# Patient Record
Sex: Female | Born: 1985 | Race: White | Hispanic: No | Marital: Single | Smoking: Former smoker
Health system: Southern US, Community
[De-identification: ages and names within clinical notes are randomized; demographics above are authoritative.]

## PROBLEM LIST (undated history)

## (undated) ENCOUNTER — Inpatient Hospital Stay: Payer: Self-pay

## (undated) DIAGNOSIS — K219 Gastro-esophageal reflux disease without esophagitis: Secondary | ICD-10-CM

## (undated) DIAGNOSIS — F419 Anxiety disorder, unspecified: Secondary | ICD-10-CM

## (undated) HISTORY — PX: HAND SURGERY: SHX662

## (undated) HISTORY — PX: DILATION AND CURETTAGE OF UTERUS: SHX78

## (undated) HISTORY — PX: WISDOM TOOTH EXTRACTION: SHX21

---
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
## 2007-03-23 ENCOUNTER — Observation Stay: Payer: Self-pay | Admitting: Unknown Physician Specialty

## 2007-05-07 ENCOUNTER — Observation Stay: Payer: Self-pay | Admitting: Obstetrics and Gynecology

## 2007-05-11 ENCOUNTER — Observation Stay: Payer: Self-pay

## 2007-05-16 ENCOUNTER — Inpatient Hospital Stay: Payer: Self-pay | Admitting: Obstetrics & Gynecology

## 2015-11-17 LAB — OB RESULTS CONSOLE VARICELLA ZOSTER ANTIBODY, IGG: VARICELLA IGG: IMMUNE

## 2015-11-17 LAB — OB RESULTS CONSOLE RUBELLA ANTIBODY, IGM: RUBELLA: IMMUNE

## 2016-03-19 LAB — OB RESULTS CONSOLE HIV ANTIBODY (ROUTINE TESTING): HIV: NONREACTIVE

## 2016-03-19 LAB — OB RESULTS CONSOLE RPR: RPR: NONREACTIVE

## 2016-03-19 LAB — OB RESULTS CONSOLE HEPATITIS B SURFACE ANTIGEN: HEP B S AG: NEGATIVE

## 2016-04-01 NOTE — L&D Delivery Note (Signed)
Delivery Note At 8:45 PM a viable female was delivered via Vaginal, Spontaneous Delivery (Presentation: LOA).  APGAR: 6, 9; weight 7 lb 0.2 oz (3180 g).   Placenta status: delivered spontaneously, intact.  Cord: 3VC with the following complications: None.  Cord pH: n/a  Anesthesia: epidural  Episiotomy: None Lacerations: None Suture Repair: n/a Est. Blood Loss (mL): 300  Mom to postpartum.  Baby to Couplet care / Skin to Skin.  Called to see patient.  Mom pushed to deliver a viable female infant.  The head followed by shoulders, which delivered without difficulty, and the rest of the body.  A single nuchal cord noted and reduced.  Baby to mom's chest.  Cord clamped and cut with no delay given initial assessment of pediatrics.  Cord blood obtained.  Placenta delivered spontaneously, intact, with a 3-vessel cord.  No vaginal, cervical, or perineal lacerations.  All counts correct.  Hemostasis obtained with IV pitocin and fundal massage. EBL 300 mL.     Thomasene MohairStephen Lea Baine, MD 06/16/2016, 11:03 PM

## 2016-05-08 ENCOUNTER — Encounter: Payer: Self-pay | Admitting: Emergency Medicine

## 2016-05-08 ENCOUNTER — Emergency Department
Admission: EM | Admit: 2016-05-08 | Discharge: 2016-05-08 | Disposition: A | Discharge status: Home / Self Care | Payer: Medicaid Other | Attending: Emergency Medicine | Admitting: Emergency Medicine

## 2016-05-08 DIAGNOSIS — O26893 Other specified pregnancy related conditions, third trimester: Secondary | ICD-10-CM | POA: Diagnosis present

## 2016-05-08 DIAGNOSIS — H9201 Otalgia, right ear: Secondary | ICD-10-CM | POA: Insufficient documentation

## 2016-05-08 DIAGNOSIS — Z3A32 32 weeks gestation of pregnancy: Secondary | ICD-10-CM | POA: Insufficient documentation

## 2016-05-08 DIAGNOSIS — Z5321 Procedure and treatment not carried out due to patient leaving prior to being seen by health care provider: Secondary | ICD-10-CM | POA: Insufficient documentation

## 2016-05-08 NOTE — ED Triage Notes (Signed)
States she is 8 months pregnant, was diagnosed with the flu last week and was told by urgent care she had a busted right ear drum, states continued right ear pain

## 2016-05-08 NOTE — ED Notes (Signed)
Called in lobby, no response 

## 2016-05-08 NOTE — ED Notes (Signed)
Called pt in lobby, no response.  

## 2016-05-09 ENCOUNTER — Telehealth: Payer: Self-pay | Admitting: Emergency Medicine

## 2016-05-09 NOTE — Telephone Encounter (Signed)
Called patient due to lwot to inquire about condition and follow up plans. Left message.   

## 2016-05-14 ENCOUNTER — Observation Stay
Admission: EM | Admit: 2016-05-14 | Discharge: 2016-05-15 | Disposition: A | Discharge status: Other Acute Inpatient Hospital | Payer: Medicaid Other | Source: Home / Self Care | Attending: Emergency Medicine | Admitting: Emergency Medicine

## 2016-05-14 ENCOUNTER — Encounter: Payer: Self-pay | Admitting: Emergency Medicine

## 2016-05-14 ENCOUNTER — Emergency Department: Payer: Medicaid Other

## 2016-05-14 DIAGNOSIS — J189 Pneumonia, unspecified organism: Secondary | ICD-10-CM | POA: Diagnosis present

## 2016-05-14 DIAGNOSIS — O99513 Diseases of the respiratory system complicating pregnancy, third trimester: Secondary | ICD-10-CM

## 2016-05-14 DIAGNOSIS — R042 Hemoptysis: Secondary | ICD-10-CM

## 2016-05-14 LAB — COMPREHENSIVE METABOLIC PANEL
ALT: 22 U/L (ref 14–54)
ANION GAP: 11 (ref 5–15)
AST: 26 U/L (ref 15–41)
Albumin: 2.7 g/dL — ABNORMAL LOW (ref 3.5–5.0)
Alkaline Phosphatase: 166 U/L — ABNORMAL HIGH (ref 38–126)
BUN: 5 mg/dL — ABNORMAL LOW (ref 6–20)
CALCIUM: 8.5 mg/dL — AB (ref 8.9–10.3)
CHLORIDE: 102 mmol/L (ref 101–111)
CO2: 21 mmol/L — AB (ref 22–32)
CREATININE: 0.42 mg/dL — AB (ref 0.44–1.00)
Glucose, Bld: 97 mg/dL (ref 65–99)
Potassium: 3.3 mmol/L — ABNORMAL LOW (ref 3.5–5.1)
SODIUM: 134 mmol/L — AB (ref 135–145)
Total Bilirubin: 0.9 mg/dL (ref 0.3–1.2)
Total Protein: 7 g/dL (ref 6.5–8.1)

## 2016-05-14 LAB — CBC WITH DIFFERENTIAL/PLATELET
Basophils Absolute: 0 10*3/uL (ref 0–0.1)
Basophils Relative: 0 %
EOS ABS: 0.1 10*3/uL (ref 0–0.7)
EOS PCT: 1 %
HCT: 36.5 % (ref 35.0–47.0)
Hemoglobin: 12.2 g/dL (ref 12.0–16.0)
LYMPHS ABS: 2.1 10*3/uL (ref 1.0–3.6)
LYMPHS PCT: 12 %
MCH: 27.4 pg (ref 26.0–34.0)
MCHC: 33.5 g/dL (ref 32.0–36.0)
MCV: 81.7 fL (ref 80.0–100.0)
MONO ABS: 1.4 10*3/uL — AB (ref 0.2–0.9)
MONOS PCT: 8 %
Neutro Abs: 13.6 10*3/uL — ABNORMAL HIGH (ref 1.4–6.5)
Neutrophils Relative %: 79 %
PLATELETS: 479 10*3/uL — AB (ref 150–440)
RBC: 4.46 MIL/uL (ref 3.80–5.20)
RDW: 14.1 % (ref 11.5–14.5)
WBC: 17.2 10*3/uL — AB (ref 3.6–11.0)

## 2016-05-14 LAB — FIBRIN DERIVATIVES D-DIMER (ARMC ONLY): FIBRIN DERIVATIVES D-DIMER (ARMC): 847 — AB (ref 0–499)

## 2016-05-14 LAB — INFLUENZA PANEL BY PCR (TYPE A & B)
INFLAPCR: NEGATIVE
INFLBPCR: NEGATIVE

## 2016-05-14 LAB — LACTIC ACID, PLASMA: LACTIC ACID, VENOUS: 1 mmol/L (ref 0.5–1.9)

## 2016-05-14 MED ORDER — ONDANSETRON HCL 4 MG/2ML IJ SOLN
4.0000 mg | Freq: Four times a day (QID) | INTRAMUSCULAR | Status: DC | PRN
  Administered 2016-05-14: 4 mg via INTRAVENOUS

## 2016-05-14 MED ORDER — ACETAMINOPHEN 325 MG PO TABS: 650.0000 mg | ORAL_TABLET | ORAL | Status: DC | PRN

## 2016-05-14 MED ORDER — CEFTRIAXONE SODIUM-DEXTROSE 1-3.74 GM-% IV SOLR
1.0000 g | Freq: Once | INTRAVENOUS | Status: AC
  Administered 2016-05-14: 1 g via INTRAVENOUS
  Filled 2016-05-14: qty 50

## 2016-05-14 MED ORDER — IPRATROPIUM-ALBUTEROL 0.5-2.5 (3) MG/3ML IN SOLN
3.0000 mL | Freq: Four times a day (QID) | RESPIRATORY_TRACT | Status: DC
  Administered 2016-05-14: 3 mL via RESPIRATORY_TRACT
  Filled 2016-05-14: qty 3

## 2016-05-14 MED ORDER — ACETAMINOPHEN 500 MG PO TABS
ORAL_TABLET | ORAL | Status: AC
  Administered 2016-05-14: 1000 mg via ORAL
  Filled 2016-05-14: qty 2

## 2016-05-14 MED ORDER — IOPAMIDOL (ISOVUE-370) INJECTION 76%
75.0000 mL | Freq: Once | INTRAVENOUS | Status: AC | PRN
  Administered 2016-05-14: 75 mL via INTRAVENOUS

## 2016-05-14 MED ORDER — DEXTROSE 5 % IV SOLN: 1.0000 g | Freq: Once | INTRAVENOUS | Status: DC

## 2016-05-14 MED ORDER — AZITHROMYCIN 250 MG PO TABS
250.0000 mg | ORAL_TABLET | Freq: Every day | ORAL | Status: DC
Start: 2016-05-15 — End: 2016-05-15

## 2016-05-14 MED ORDER — CALCIUM CARBONATE ANTACID 500 MG PO CHEW: 2.0000 | CHEWABLE_TABLET | ORAL | Status: DC | PRN

## 2016-05-14 MED ORDER — DEXTROSE 5 % IV SOLN
500.0000 mg | Freq: Once | INTRAVENOUS | Status: DC
  Administered 2016-05-14: 500 mg via INTRAVENOUS
  Filled 2016-05-14: qty 500

## 2016-05-14 MED ORDER — ACETAMINOPHEN 500 MG PO TABS
1000.0000 mg | ORAL_TABLET | Freq: Once | ORAL | Status: AC
  Administered 2016-05-14: 1000 mg via ORAL

## 2016-05-14 MED ORDER — SODIUM CHLORIDE 0.9 % IV SOLN
INTRAVENOUS | Status: DC
  Administered 2016-05-14: 23:00:00 via INTRAVENOUS

## 2016-05-14 MED ORDER — SODIUM CHLORIDE 0.9 % IV BOLUS (SEPSIS)
1000.0000 mL | Freq: Once | INTRAVENOUS | Status: AC
  Administered 2016-05-14: 1000 mL via INTRAVENOUS

## 2016-05-14 MED ORDER — DOCUSATE SODIUM 100 MG PO CAPS: 100.0000 mg | ORAL_CAPSULE | Freq: Every day | ORAL | Status: DC

## 2016-05-14 MED ORDER — ALBUTEROL SULFATE (2.5 MG/3ML) 0.083% IN NEBU
2.5000 mg | INHALATION_SOLUTION | Freq: Once | RESPIRATORY_TRACT | Status: AC
  Administered 2016-05-14: 2.5 mg via RESPIRATORY_TRACT
  Filled 2016-05-14: qty 3

## 2016-05-14 MED ORDER — PRENATAL MULTIVITAMIN CH: 1.0000 | ORAL_TABLET | Freq: Every day | ORAL | Status: DC

## 2016-05-14 MED ORDER — ONDANSETRON HCL 4 MG/2ML IJ SOLN
INTRAMUSCULAR | Status: AC
  Administered 2016-05-14: 4 mg via INTRAVENOUS
  Filled 2016-05-14: qty 2

## 2016-05-14 NOTE — ED Triage Notes (Addendum)
Pt c/o green productive cough. Was dx with flu a couple weeks ago and finished tamiflu 1.5 weeks ago. Has been on Augmentin for ear infection. Has had decreased hearing in right ear. Feels SHOB. All sx have been gradually worsening over last week and half. Mild tachypnea but not labored at this time.

## 2016-05-14 NOTE — ED Notes (Signed)
Pt taken to US via stretcher.  Attempted to find FHT with external doppler. Was unsuccessful. Lea, RN found heart beat with US machine. Strong and consistent.

## 2016-05-14 NOTE — ED Notes (Signed)
Pt presents with cough x 1.5 weeks. She also has bilateral ear pain. States she was seen recently for ruptured tympanic membrane and put on Augmentin, beginning 6 days ago. She feels that her left ear is now involved. She states she is sob. Pt is [redacted] weeks pregnant.

## 2016-05-14 NOTE — ED Provider Notes (Signed)
[facility] Emergency Department Provider Note ____________________________________________   I have reviewed the triage vital signs and the triage nursing note.  HISTORY  Chief Complaint Cough   Historian Patient, aunt and significant other  HPI Karen Duarte is a 31 y.o. female presents approximately [redacted] weeks pregnant stating that she has  As been feeling unwell with a respiratory condition for about 2 weeks now. Several weeks ago she developed flulike symptoms and cough and was treated presumptively with a course of Tamiflu and finished about a week ago. She was not actually tested at that time. Since then she continued to have congestion and cough especially when she would take a deep breath. She's continued to feel overall poorly with decreased by mouth intake.She feels like she is probably dehydrated.No recent fever. She states that she was seen at another health care facility and had a head CT given ear pain and was placed on Augmentin which she is currently taking. Today she went to her OB/GYN office and because of persistent respiratory symptoms and the fact that her abdomen was measuring at 32 weeks as opposed to 35 weeks, she was referred into the emergency room for further evaluation.  She is quite frustrated at her persistence of symptoms and ill state, and she is very anxious and worried about the baby. States that she is feeling the baby move, but maybe less so than typical.    History reviewed. No pertinent past medical history.  There are no active problems to display for this patient.   History reviewed. No pertinent surgical history.  Prior to Admission medications   Not on File    No Known Allergies  History reviewed. No pertinent family history.  Social History Social History  Substance Use Topics  . Smoking status: Former Games developer  . Smokeless tobacco: Never Used  . Alcohol use No    Review of Systems  Constitutional:  Negative for fever. Eyes: Negative for visual changes. ENT: Negative for sore throat. Cardiovascular: positive for central chest pressure, positive for somewhat worse with breathing. Respiratory: positive for coughing, productive of sputum as well as blood, and for shortness of breath. Gastrointestinal: Negative for abdominal pain, vomiting and diarrhea. Genitourinary: Negative for dysuria. Musculoskeletal: Negative for back pain. Skin: Negative for rash. Neurological: Negative for headache. 10 point Review of Systems otherwise negative ____________________________________________   PHYSICAL EXAM:  VITAL SIGNS: ED Triage Vitals  Enc Vitals Group     BP 05/14/16 1627 116/77     Pulse Rate 05/14/16 1627 (!) 110     Resp 05/14/16 1627 (!) 26     Temp 05/14/16 1627 98.1 F (36.7 C)     Temp Source 05/14/16 1627 Oral     SpO2 05/14/16 1627 97 %     Weight 05/14/16 1624 147 lb (66.7 kg)     Height 05/14/16 1624 5\' 3"  (1.6 m)     Head Circumference --      Peak Flow --      Pain Score --      Pain Loc --      Pain Edu? --      Excl. in GC? --      Constitutional: Alert and oriented. Well appearing and in no distress. HEENT   Head: Normocephalic and atraumatic.      Eyes: Conjunctivae are normal. PERRL. Normal extraocular movements.      Ears:         Nose: No congestion/rhinnorhea.   Mouth/Throat: Mucous  membranes are ildly dry.   Neck: No stridor. Cardiovascular/Chest:tachycardic, regular rhythm.  No murmurs, rubs, or gallops. Respiratory: Normal respiratory effort without tachypnea nor retractions. With the breast expiratory wheezing and bronchospastic cough. No rhonchi or rales Gastrointestinal: Soft. No distention, no guarding, no rebound. Nontender. Gravid well above the umbilicus Genitourinary/rectal:Deferred Musculoskeletal: Nontender with normal range of motion in all extremities. No joint effusions.  No lower extremity tenderness.  No edema. Neurologic:   Normal speech and language. No gross or focal neurologic deficits are appreciated. Skin:  Skin is warm, dry and intact. No rash noted. Psychiatric: Mood and affect are normal. Speech and behavior are normal. Patient exhibits appropriate insight and judgment.     ____________________________________________  LABS (pertinent positives/negatives)  Labs Reviewed  CBC WITH DIFFERENTIAL/PLATELET - Abnormal; Notable for the following:       Result Value   WBC 17.2 (*)    Platelets 479 (*)    Neutro Abs 13.6 (*)    Monocytes Absolute 1.4 (*)    All other components within normal limits  COMPREHENSIVE METABOLIC PANEL - Abnormal; Notable for the following:    Sodium 134 (*)    Potassium 3.3 (*)    CO2 21 (*)    BUN 5 (*)    Creatinine, Ser 0.42 (*)    Calcium 8.5 (*)    Albumin 2.7 (*)    Alkaline Phosphatase 166 (*)    All other components within normal limits  FIBRIN DERIVATIVES D-DIMER ([facility] ONLY) - Abnormal; Notable for the following:    Fibrin derivatives D-dimer ([facility]) 847 (*)    All other components within normal limits  CULTURE, BLOOD (ROUTINE X 2)  CULTURE, BLOOD (ROUTINE X 2)  INFLUENZA PANEL BY PCR (TYPE A & B)    ____________________________________________    EKG I, Governor Rooksebecca Susy Placzek, MD, the attending physician have personally viewed and interpreted all ECGs.  111 bpm. Sinus tachycardia. Narrow QRS. Normal axis. Nonspecific T wave. ____________________________________________  RADIOLOGY All Xrays were viewed by me. Imaging interpreted by Radiologist.  Chest x-ray two-view:IMPRESSION: No edema or consolidation.  Ultrasound lower extremity venous Dopplers:  IMPRESSION: No evidence of deep venous thrombosis in either lower extremity. Slow/Rouleaux type flow noted in each lower extremity. Clinical significance of this finding uncertain.  Chest CT angiogram:  IMPRESSION: 1. No CT evidence for acute pulmonary embolism. 2. Multifocal ground-glass opacities involving  the bilateral lungs, most notable within the right middle lobe and lingula. Findings most suspicious for multifocal infection/pneumonia. 3. Mildly enlarged 12 mm right hilar lymph node, likely reactive.  __________________________________________  PROCEDURES  Procedure(s) performed: None  Critical Care performed: None  ____________________________________________   ED COURSE / ASSESSMENT AND PLAN  Pertinent labs & imaging results that were available during my care of the patient were reviewed by me and considered in my medical decision making (see chart for details).   Ms. Rubye OaksDickerson presented from the OB/GYN office for continued respiratory symptoms and small for dates abdominal measurement. On exam she has significant wheezing/bronchospasm and describes recent flulike illness for which she was treated presumptively with Tamiflu, and currently on Augmentin but persistent coughing including hemoptysis and productive cough.  No hypoxia. She is tachycardic here, but states that her normal is 130 bpm.  We discussed treatment with albuterol in checking with an x-ray, suspecting that PE is unlikely with an alternate diagnosis of bronchitis/bronchospasm much more likely. Patient did not experience much improvement after trial of albuterol. I spoke with Dr. Chauncey CruelStabler who did recommend conservative stepwise  evaluation including d-dimer and lower extremity ultrasound.  Laboratory studies show elevated white blood cell count with left shift. Again consider failed outpatient infection, but chest x-ray clear pneumonia.  D-dimer came back significantly elevated above 800. CT angios was performed and there is no PE, but multifocal pneumonia. Blood cultures were obtained and patient was placed on Rocephin and azithromycin. I spoke again with Dr. Chauncey Cruel who will admit the patient. Although I do not have a high suspicion for sepsis, and patient states that her heart rate is normally elevated, she doesn't  fact have a source of infection on CT, as well as elevated white blood cell count and tachycardia, I did add a lactate. For now clinical hydration, patient had received 1 L normal saline. Lactate pending upon consultation for admission to OB/GYN.  CONSULTATIONS:   Dr. Chauncey Cruel, OB/GYN for admission.   Patient / Family / Caregiver informed of clinical course, medical decision-making process, and agree with plan.   ___________________________________________   FINAL CLINICAL IMPRESSION(S) / ED DIAGNOSES   Final diagnoses:  Cough with hemoptysis  Multifocal pneumonia              Note: This dictation was prepared with Dragon dictation. Any transcriptional errors that result from this process are unintentional    Governor Rooks, MD 05/14/16 [id number]

## 2016-05-14 NOTE — ED Notes (Signed)
Pt given underwear and pad. Urinated when she coughed.

## 2016-05-15 ENCOUNTER — Inpatient Hospital Stay: Payer: Medicaid Other

## 2016-05-15 ENCOUNTER — Inpatient Hospital Stay
Admit: 2016-05-15 | Discharge: 2016-05-18 | DRG: 781 | Disposition: A | Discharge status: Home / Self Care | Payer: Medicaid Other | Attending: Obstetrics and Gynecology | Admitting: Obstetrics and Gynecology

## 2016-05-15 DIAGNOSIS — E876 Hypokalemia: Secondary | ICD-10-CM | POA: Diagnosis present

## 2016-05-15 DIAGNOSIS — A419 Sepsis, unspecified organism: Secondary | ICD-10-CM | POA: Diagnosis present

## 2016-05-15 DIAGNOSIS — F17210 Nicotine dependence, cigarettes, uncomplicated: Secondary | ICD-10-CM | POA: Diagnosis present

## 2016-05-15 DIAGNOSIS — O99613 Diseases of the digestive system complicating pregnancy, third trimester: Secondary | ICD-10-CM | POA: Diagnosis present

## 2016-05-15 DIAGNOSIS — O9989 Other specified diseases and conditions complicating pregnancy, childbirth and the puerperium: Secondary | ICD-10-CM | POA: Diagnosis present

## 2016-05-15 DIAGNOSIS — Z3A35 35 weeks gestation of pregnancy: Secondary | ICD-10-CM | POA: Diagnosis not present

## 2016-05-15 DIAGNOSIS — O99513 Diseases of the respiratory system complicating pregnancy, third trimester: Principal | ICD-10-CM | POA: Diagnosis present

## 2016-05-15 DIAGNOSIS — O99333 Smoking (tobacco) complicating pregnancy, third trimester: Secondary | ICD-10-CM | POA: Diagnosis present

## 2016-05-15 DIAGNOSIS — O98813 Other maternal infectious and parasitic diseases complicating pregnancy, third trimester: Secondary | ICD-10-CM | POA: Diagnosis present

## 2016-05-15 DIAGNOSIS — H6693 Otitis media, unspecified, bilateral: Secondary | ICD-10-CM | POA: Diagnosis present

## 2016-05-15 DIAGNOSIS — K219 Gastro-esophageal reflux disease without esophagitis: Secondary | ICD-10-CM | POA: Diagnosis present

## 2016-05-15 DIAGNOSIS — J189 Pneumonia, unspecified organism: Secondary | ICD-10-CM | POA: Diagnosis present

## 2016-05-15 DIAGNOSIS — R05 Cough: Secondary | ICD-10-CM | POA: Diagnosis present

## 2016-05-15 DIAGNOSIS — O365990 Maternal care for other known or suspected poor fetal growth, unspecified trimester, not applicable or unspecified: Secondary | ICD-10-CM

## 2016-05-15 HISTORY — DX: Gastro-esophageal reflux disease without esophagitis: K21.9

## 2016-05-15 LAB — EXPECTORATED SPUTUM ASSESSMENT W REFEX TO RESP CULTURE

## 2016-05-15 LAB — EXPECTORATED SPUTUM ASSESSMENT W GRAM STAIN, RFLX TO RESP C: Special Requests: NORMAL

## 2016-05-15 MED ORDER — DEXTROSE 5 % IV SOLN
1.0000 g | Freq: Two times a day (BID) | INTRAVENOUS | Status: DC
  Administered 2016-05-15 – 2016-05-18 (×7): 1 g via INTRAVENOUS
  Filled 2016-05-15 (×9): qty 10

## 2016-05-15 MED ORDER — METHYLPREDNISOLONE SODIUM SUCC 125 MG IJ SOLR
40.0000 mg | Freq: Every day | INTRAMUSCULAR | Status: DC
  Administered 2016-05-15 – 2016-05-17 (×3): 40 mg via INTRAVENOUS
  Filled 2016-05-15 (×4): qty 2

## 2016-05-15 MED ORDER — DEXTROSE 5 % IV SOLN
500.0000 mg | INTRAVENOUS | Status: DC
  Administered 2016-05-15 – 2016-05-16 (×2): 500 mg via INTRAVENOUS
  Filled 2016-05-15 (×3): qty 500

## 2016-05-15 MED ORDER — GUAIFENESIN-CODEINE 100-10 MG/5ML PO SOLN
10.0000 mL | ORAL | Status: DC | PRN
  Administered 2016-05-15 – 2016-05-18 (×17): 10 mL via ORAL
  Filled 2016-05-15 (×19): qty 10

## 2016-05-15 MED ORDER — ONDANSETRON HCL 4 MG/2ML IJ SOLN
4.0000 mg | Freq: Four times a day (QID) | INTRAMUSCULAR | Status: DC | PRN
  Administered 2016-05-15: 4 mg via INTRAVENOUS
  Filled 2016-05-15: qty 2

## 2016-05-15 MED ORDER — IPRATROPIUM-ALBUTEROL 0.5-2.5 (3) MG/3ML IN SOLN
3.0000 mL | RESPIRATORY_TRACT | Status: DC | PRN
  Administered 2016-05-15 – 2016-05-18 (×14): 3 mL via RESPIRATORY_TRACT
  Filled 2016-05-15 (×15): qty 3

## 2016-05-15 MED ORDER — GUAIFENESIN ER 600 MG PO TB12
600.0000 mg | ORAL_TABLET | Freq: Two times a day (BID) | ORAL | Status: DC
  Administered 2016-05-15 – 2016-05-18 (×8): 600 mg via ORAL
  Filled 2016-05-15 (×8): qty 1

## 2016-05-15 MED ORDER — ACETAMINOPHEN 500 MG PO TABS
1000.0000 mg | ORAL_TABLET | Freq: Four times a day (QID) | ORAL | Status: DC | PRN
  Administered 2016-05-15 (×3): 1000 mg via ORAL
  Filled 2016-05-15 (×3): qty 2

## 2016-05-15 MED ORDER — PRENATAL MULTIVITAMIN CH
1.0000 | ORAL_TABLET | Freq: Every day | ORAL | Status: DC
  Administered 2016-05-15 – 2016-05-17 (×2): 1 via ORAL
  Filled 2016-05-15 (×4): qty 1

## 2016-05-15 MED ORDER — DIPHENHYDRAMINE HCL 25 MG PO CAPS
25.0000 mg | ORAL_CAPSULE | Freq: Four times a day (QID) | ORAL | Status: DC | PRN
  Administered 2016-05-15 – 2016-05-18 (×3): 25 mg via ORAL
  Filled 2016-05-15 (×3): qty 1

## 2016-05-15 MED ORDER — CEFTRIAXONE SODIUM 1 G IJ SOLR: 1.0000 g | Freq: Two times a day (BID) | INTRAMUSCULAR | Status: DC

## 2016-05-15 MED ORDER — SODIUM CHLORIDE 0.9 % IV SOLN
INTRAVENOUS | Status: DC
  Administered 2016-05-15 – 2016-05-18 (×7): via INTRAVENOUS

## 2016-05-15 MED ORDER — SODIUM CHLORIDE FLUSH 0.9 % IV SOLN
INTRAVENOUS | Status: AC
  Administered 2016-05-15: 02:00:00
  Filled 2016-05-15: qty 10

## 2016-05-15 MED ORDER — BUDESONIDE 0.5 MG/2ML IN SUSP
0.5000 mg | Freq: Two times a day (BID) | RESPIRATORY_TRACT | Status: DC
  Administered 2016-05-15 – 2016-05-18 (×7): 0.5 mg via RESPIRATORY_TRACT
  Filled 2016-05-15 (×8): qty 2

## 2016-05-15 MED ORDER — DEXTROSE 5 % IV SOLN: 1.0000 g | INTRAVENOUS | Status: DC

## 2016-05-15 NOTE — Progress Notes (Signed)
Benign Gynecology Progress Note  Admission Date: 05/15/2016 Current Date: 05/15/2016  Karen Duarte is a 31 y.o. [id number] HD#1 @ 682w4d, admitted for fever/malaise/cough with dx of PNEUMONIA .  History complicated by: Patient Active Problem List   Diagnosis Date Noted  . Pneumonia affecting pregnancy in third trimester 05/14/2016   ROS and patient/family/surgical history, located on admission H&P note dated 05/15/2016, have been reviewed, and there are no changes except as noted below  Yesterday/Overnight Events:  Still has cough. Receiving nebulizer treatments, as well as ABX.  Subjective:  Feels a little better but still weak, tired, coughing, hot and cold.  Objective:   Vitals:   05/15/16 0213 05/15/16 0322 05/15/16 0403 05/15/16 0806  BP:  (!) 90/59  107/66  Pulse:  (!) 106  (!) 107  Resp:  19  (!) 24  Temp:  98.3 F (36.8 C)  (!) 100.4 F (38 C)  TempSrc:  Axillary  Oral  SpO2:  99% 97% 99%  Weight: 147 lb (66.7 kg)     Height: 5\' 3"  (1.6 m)      Intake/Output Summary (Last 24 hours) at 05/15/16 0851 Last data filed at 05/15/16 0800  Gross per 24 hour  Intake           691.67 ml  Output              370 ml  Net           321.67 ml   Physical exam: General appearance: alert, cooperative and no distress Abdomen: Vtx, gravid, NT GU: No gross VB Lungs: rhonchi bilaterally Heart: regular rate and rhythm and no MRGs Extremities: no redness or tenderness in the calves or thighs, no edema Skin: no lesions Psych: appropriate   Recent Labs Lab 05/14/16 1629  NA 134*  K 3.3*  CL 102  CO2 21*  BUN 5*  CREATININE 0.42*  GLUCOSE 97    Recent Labs Lab 05/14/16 1629  WBC 17.2*  HGB 12.2  HCT 36.5  PLT 479*     Recent Labs Lab 05/14/16 1629  CALCIUM 8.5*   No results for input(s): INR, APTT in the last 168 hours.     Recent Labs Lab 05/14/16 1629  ALKPHOS 166*  BILITOT 0.9  PROT 7.0  ALT 22  AST 26     Assessment & Plan:  [redacted] weeks  pregnant Pneumonia Prior flu, s/p Tamiflu  Azithromycin and Rocephin IV started Nebulizer treatments Management of sx's US today for fetal growth Monitor Fetal Wellbeing daily Diet, Amb Consult Medicine for short and long term pneumonia management

## 2016-05-15 NOTE — H&P (Signed)
OB History & Physical   History of Present Illness:  Chief Complaint:  Presents from ED with pneumonia per CT. Had originally presented to ER with cough and SOB. HPI:  Karen Duarte is a 31 y.o. [phone] female with EDC=06/15/2016 at 3892w4d dated by a 10 wk 6 d ultrasound.  Her pregnancy has been complicated by flulike symptoms and was treated presumptively for the flu with Tamiflu (RX 2/5). She was then diagnosed with a right ear infection/ [facility] and was treated with amoxicillin then Augmentin. She presented to the office 2/13 with complaints of congestion, cough, pain when taking a deep breath, green sputum and rhinorrhea. She was sent to the ER and a CT revealed multifocal ground glass opacities involving bilateral lungs, most notable in the RML and lingula, suspicious for pneumonia. She was started on Azithromycin and Rocephin and given 2 nebulizer treatments and is sent to L&D for evaluation of fetal status. She was measuring 33 cm today in the clinic today. (34 cm at 33wk4d) . Denies contractions.    Prenatal care [facility]. Received TDAP on 04/16/2016       Maternal Medical History:  History reviewed. No pertinent past medical history.  Past Surgical History:  Procedure Laterality Date  . HAND SURGERY      No Known Allergies  Prior to Admission medications   Medication Sig Start Date End Date Taking? Authorizing Provider  acetaminophen (TYLENOL) 325 MG tablet Take 650 mg by mouth every 6 (six) hours as needed for mild pain, fever or headache.    Historical Provider, MD  amoxicillin-clavulanate (AUGMENTIN) 875-125 MG tablet Take 1 tablet by mouth 2 (two) times daily. 05/08/16 05/17/16  Historical Provider, MD  dextromethorphan (DELSYM) 30 MG/5ML liquid Take 30 mg by mouth as needed for cough.    Historical Provider, MD  doxylamine, Sleep, (UNISOM) 25 MG tablet Take 25 mg by mouth at bedtime as needed for sleep.    Historical Provider,  MD  guaiFENesin (MUCINEX) 600 MG 12 hr tablet Take 600 mg by mouth 2 (two) times daily as needed for cough.    Historical Provider, MD  ibuprofen (ADVIL,MOTRIN) 200 MG tablet Take 400 mg by mouth every 6 (six) hours as needed for fever, headache or mild pain.    Historical Provider, MD          Social History: She  reports that she has been smoking Cigarettes.  She has been smoking about 0.25 packs per day. She has never used smokeless tobacco. She reports that she does not drink alcohol or use drugs. Last smoked 1 week ago.  Family History: family history includes Diabetes Mellitus I in her father; Diabetes Mellitus II in her paternal grandmother; Hypertension in her paternal grandmother.   Review of Systems: Negative x 10 systems reviewed except as noted in the HPI.      Physical Exam:  Vital Signs: BP 124/66 (BP Location: Left Arm)   Pulse (!) 119   Temp 97.8 F (36.6 C) (Oral)   Ht 5\' 3"  (1.6 m)   Wt 147 lb (66.7 kg)   BMI 26.04 kg/m   General: coughing frequently, appears pale HEENT: conjunctiva pale Heart: tachycardic and regular rhythm. No murmurs/rubs/gallops Lungs: clear to auscultation bilaterally Abdomen: soft, gravid, non-tender;  Cephalic presentation Pelvic: Deferred, not contracting   Extremities: non-tender, symmetric, no edema bilaterally.   Neurologic: Alert & oriented x 3.    Pertinent Results:  Prenatal Labs: Blood type/Rh  O positive  Antibody screen negative  Rubella Varicella Immune immune  RPR Non reactive  HBsAg negative  HIV Non reactive  GC negative  Chlamydia negative  Genetic screening NT 1.14mm  1 hour GTT 110  3 hour GTT NA  GBS     Baseline FHR: 135-140 baseline with accelerations to 170s, moderate variability Toco: acontractile  Bedside Ultrasound:  Cephalic (OP)  AFI=2.7cm+3.38cm+2.22cm+1.38cm=9.68cm  Anterior placenta    Assessment:  Karen Duarte is a 31 y.o. [phone] female at [redacted]w[redacted]d with bilateral pneumonia FWB:  CAt1 tracing / normal AFI  Plan:  1. Admited to Labor & Delivery for observation initially-will transfer to antenatal unit 2. IVF, IV Rocephin and Azithromycin 3. Nebulizer treatments q4 prn   4. Regular diet as tolerated. 5. Robitussin with codeine and mUcinex for cough 6. Tylenol for headache/fever 7. Droplet precautions  Kord Monette  05/15/2016 2:53 AM

## 2016-05-15 NOTE — OB Triage Note (Signed)
Patient presents to Labor and Delivery from the ED with complaints of respiratory difficulties.  Patient has been diagnosed with multifocal pneumonia. Patient denies vaginal bleeding, leaking fluid,  and contractions.  Patient states that she has had decreased fetal movement,  EFM applied and explained Plan to monitor fetal and maternal well-being and assess for fetal well-being.

## 2016-05-15 NOTE — Consult Note (Signed)
[facility] - [place] at [facility]   PATIENT NAME: Karen Duarte    MR#:  [id number]  DATE OF BIRTH:  [birth year]  DATE OF ADMISSION:  05/15/2016  PRIMARY CARE PHYSICIAN: Dr Velora Mediate  REQUESTING/REFERRING PHYSICIAN: Dr Velora Mediate  CHIEF COMPLAINT:   Chief Complaint  Patient presents with  . Respiratory Distress    HISTORY OF PRESENT ILLNESS:  Karen Duarte  is a 31 y.o. female presented with shortness of breath. Has not been feeling well for the last 2 weeks. It started off as the flu. She was placed on Tamiflu. Then she was diagnosed with an ear infection and started on amoxicillin then switched over to Augmentin. She was sent in from her primary care physician's office and was admitted yesterday for pneumonia. She complains of some pain in the chest with coughing. It hurts with a deep breath. When she lies down she wheezes. She has had a fever. She is almost [redacted] weeks pregnant.  PAST MEDICAL HISTORY:   Past Medical History:  Diagnosis Date  . GERD (gastroesophageal reflux disease)     PAST SURGICAL HISTOIRY:   Past Surgical History:  Procedure Laterality Date  . HAND SURGERY    . WISDOM TOOTH EXTRACTION      SOCIAL HISTORY:   Social History  Substance Use Topics  . Smoking status: Current Every Day Smoker    Packs/day: 0.25    Types: Cigarettes  . Smokeless tobacco: Never Used     Comment: last used 1 week ago  . Alcohol use No    FAMILY HISTORY:   Family History  Problem Relation Age of Onset  . Diabetes Mellitus I Father   . Diabetes Mellitus II Paternal Grandmother   . Hypertension Paternal Grandmother     DRUG ALLERGIES:  No Known Allergies  REVIEW OF SYSTEMS:  CONSTITUTIONAL: fati positive for fever. Positive for weakness.  EYES: No blurred or double vision.  EARS, NOSE, AND THROAT:  decreased hearing bilaterally. Positive tenderness right ear  RESPIRATORY: positive for cough with greenish phlegm. Positive for   shortness of breath.  positive for wheezing.  nohemoptysis.  CARDIOVASCULAR: positive for chest pain.  GASTROINTESTINAL: Positive for nausea, vomiting yesterday. Some lower  abdominal pain.  diarrhea yesterday  GENITOURINARY: No dysuria, hematuria.  ENDOCRINE: No polyuria, nocturia,  HEMATOLOGY: No anemia, easy bruising or bleeding SKIN: No rash or lesion. MUSCULOSKELETAL: No joint pain or arthritis.   NEUROLOGIC: No tingling, numbness, weakness.  PSYCHIATRY: No anxiety or depression.   MEDICATIONS AT HOME:   Prior to Admission medications   Medication Sig Start Date End Date Taking? Authorizing Provider  acetaminophen (TYLENOL) 325 MG tablet Take 650 mg by mouth every 6 (six) hours as needed for mild pain, fever or headache.    Historical Provider, MD  amoxicillin-clavulanate (AUGMENTIN) 875-125 MG tablet Take 1 tablet by mouth 2 (two) times daily. 05/08/16 05/17/16  Historical Provider, MD  dextromethorphan (DELSYM) 30 MG/5ML liquid Take 30 mg by mouth as needed for cough.    Historical Provider, MD  doxylamine, Sleep, (UNISOM) 25 MG tablet Take 25 mg by mouth at bedtime as needed for sleep.    Historical Provider, MD  guaiFENesin (MUCINEX) 600 MG 12 hr tablet Take 600 mg by mouth 2 (two) times daily as needed for cough.    Historical Provider, MD  ibuprofen (ADVIL,MOTRIN) 200 MG tablet Take 400 mg by mouth every 6 (six) hours as needed for fever, headache or mild pain.    Historical  Provider, MD      VITAL SIGNS:  Blood pressure 107/66, pulse (!) 107, temperature (!) 100.4 F (38 C), temperature source Oral, resp. rate (!) 24, height 5\' 3"  (1.6 m), weight 66.7 kg (147 lb), SpO2 99 %.  PHYSICAL EXAMINATION:  GENERAL:  31 y.o.-year-old patient lying in the bed with no acute distress  but coughing a lot  EYES: Pupils equal, round, reactive to light and accommodation. No scleral icterus. Extraocular muscles intact.  HEENT: Head atraumatic, normocephalic. Oropharynx and nasopharynx clear.   tympanic membrane bilaterally erythematous and bulging.  NECK:  Supple, no jugular venous distention. No thyroid enlargement, positive for tenderness in the area of her thyroid.  LUNGS: decreased  breath sounds bilaterally, slight expiratory wheezing. No  rales,rhonchi or crepitation. No use of accessory muscles of respiration.  CARDIOVASCULAR: S1, S2 tachycardic. No murmurs, rubs, or gallops.  ABDOMEN: Soft, nontender, nondistended. Bowel sounds present. No organomegaly or mass.  EXTREMITIES: No pedal edema, cyanosis, or clubbing.  NEUROLOGIC: Cranial nerves II through XII are intact. Muscle strength 5/5 in all extremities. Sensation intact. Gait not checked.  PSYCHIATRIC: The patient is alert and oriented x 3.  SKIN: No obvious rash, lesion, or ulcer.   LABORATORY PANEL:   CBC  Recent Labs Lab 05/14/16 1629  WBC 17.2*  HGB 12.2  HCT 36.5  PLT 479*   ------------------------------------------------------------------------------------------------------------------  Chemistries   Recent Labs Lab 05/14/16 1629  NA 134*  K 3.3*  CL 102  CO2 21*  GLUCOSE 97  BUN 5*  CREATININE 0.42*  CALCIUM 8.5*  AST 26  ALT 22  ALKPHOS 166*  BILITOT 0.9   ------------------------------------------------------------------------------------------------------------------    RADIOLOGY:  Dg Chest 2 View  Result Date: 05/14/2016 CLINICAL DATA:  Productive cough EXAM: CHEST  2 VIEW COMPARISON:  None. FINDINGS: Lungs are clear. Heart size and pulmonary vascularity are normal. No adenopathy. No bone lesions. IMPRESSION: No edema or consolidation. Electronically Signed   By: Bretta BangWilliam  Woodruff III M.D.   On: 05/14/2016 16:54   Ct Angio Chest Pe W/cm &/or Wo Cm  Result Date: 05/14/2016 CLINICAL DATA:  Initial evaluation for hemoptysis, elevated D-dimer, cough. EXAM: CT ANGIOGRAPHY CHEST WITH CONTRAST TECHNIQUE: Multidetector CT imaging of the chest was performed using the standard protocol  during bolus administration of intravenous contrast. Multiplanar CT image reconstructions and MIPs were obtained to evaluate the vascular anatomy. CONTRAST:  75 cc of Isovue 370. COMPARISON:  Prior radiograph from earlier the same day. FINDINGS: Cardiovascular: Intrathoracic aorta of normal caliber and appearance without acute abnormality. Visualized great vessels within normal limits. Heart size normal.  No pericardial effusion. Pulmonary arterial tree adequately opacified for evaluation. Main pulmonary artery measures within normal limits for diameter at 2.8 cm. No filling defect to suggest acute pulmonary embolism identified. Re-formatted imaging confirms these findings. Mediastinum/Nodes: Visualized thyroid is normal. No pathologically enlarged mediastinal lymph nodes identified. Mildly prominent right hilar node measures 12 mm, which may be reactive. No left hilar adenopathy. No axillary adenopathy. Esophagus within normal limits. Lungs/Pleura: Patchy multifocal ground-glass opacities present within the right middle lobe and lingula, suspicious for possible infection. Additional multifocal ground-glass opacities within the bilateral upper lobes and lower lobes, greater on the right, also concerning for possible multifocal infection. No pulmonary edema or pleural effusion. No pneumothorax. No worrisome pulmonary nodule or mass. Upper Abdomen: Visualized upper abdomen unremarkable. Musculoskeletal: No acute osseus abnormality. No worrisome lytic or blastic osseous lesions. Review of the MIP images confirms the above findings. IMPRESSION: 1. No  CT evidence for acute pulmonary embolism. 2. Multifocal ground-glass opacities involving the bilateral lungs, most notable within the right middle lobe and lingula. Findings most suspicious for multifocal infection/pneumonia. 3. Mildly enlarged 12 mm right hilar lymph node, likely reactive. Electronically Signed   By: Rise Mu M.D.   On: 05/14/2016 21:26   US  Venous Img Lower Bilateral  Result Date: 05/14/2016 CLINICAL DATA:  Shortness of breath with hemoptysis. Third trimester gestation. EXAM: BILATERAL LOWER EXTREMITY VENOUS DUPLEX ULTRASOUND TECHNIQUE: Gray-scale sonography with graded compression, as well as color Doppler and duplex ultrasound were performed to evaluate the lower extremity deep venous systems from the level of the common femoral vein and including the common femoral, femoral, profunda femoral, popliteal and calf veins including the posterior tibial, peroneal and gastrocnemius veins when visible. The superficial great saphenous vein was also interrogated. Spectral Doppler was utilized to evaluate flow at rest and with distal augmentation maneuvers in the common femoral, femoral and popliteal veins. COMPARISON:  None. FINDINGS: RIGHT LOWER EXTREMITY Common Femoral Vein: No evidence of thrombus. Normal compressibility, respiratory phasicity and response to augmentation. Saphenofemoral Junction: No evidence of thrombus. Normal compressibility and flow on color Doppler imaging. Profunda Femoral Vein: No evidence of thrombus. Normal compressibility and flow on color Doppler imaging. Femoral Vein: No evidence of thrombus. Normal compressibility, respiratory phasicity and response to augmentation. Popliteal Vein: No evidence of thrombus. Normal compressibility, respiratory phasicity and response to augmentation. Calf Veins: No evidence of thrombus. Normal compressibility and flow on color Doppler imaging. Superficial Great Saphenous Vein: No evidence of thrombus. Normal compressibility and flow on color Doppler imaging. Venous Reflux:  None. Other Findings:  Slow lower extremity venous blood flow noted. LEFT LOWER EXTREMITY Common Femoral Vein: No evidence of thrombus. Normal compressibility, respiratory phasicity and response to augmentation. Saphenofemoral Junction: No evidence of thrombus. Normal compressibility and flow on color Doppler imaging.  Profunda Femoral Vein: No evidence of thrombus. Normal compressibility and flow on color Doppler imaging. Femoral Vein: No evidence of thrombus. Normal compressibility, respiratory phasicity and response to augmentation. Popliteal Vein: No evidence of thrombus. Normal compressibility, respiratory phasicity and response to augmentation. Calf Veins: No evidence of thrombus. Normal compressibility and flow on color Doppler imaging. Superficial Great Saphenous Vein: No evidence of thrombus. Normal compressibility and flow on color Doppler imaging. Venous Reflux:  None. Other Findings:  Slow lower extremity venous blood flow noted. IMPRESSION: No evidence of deep venous thrombosis in either lower extremity. Slow/Rouleaux type flow noted in each lower extremity. Clinical significance of this finding uncertain. Electronically Signed   By: Bretta Bang III M.D.   On: 05/14/2016 21:03      IMPRESSION AND PLAN:   1. Clinical sepsis present on admission with tachycardia, leukocytosis and bilateral multifocal groundglass pneumonia. Patient was started on Rocephin and Zithromax in the ER. Continue these medications while in the hospital. Start Solu-Medrol 40 mg daily (pregnancy class C). Case discussed with pharmacist and gynecologist and benefits out weigh the risks at this point to get the patient breathing better. Start budesonide nebulizers (pregnancy class B). Obtain sputum culture. Continue IV fluids. Upon discharge likely will end up needing a prednisone taper, finish up 5 days total of Zithromax and can either go on Ceftin 500 mg twice a day for finish up her Augmentin that she has at home. 2. Bilateral otitis media. Antibiotics would cover 3. GERD. Patient takes when necessary Zantac at home 4. Tobacco abuse. Patient states that she stop smoking over the last week.  5. Neck tenderness in the area of her thyroid. This could be lymph nodes but I will check thyroid profile in the a.m.  All the records are  reviewed and case discussed with Consulting provider. Management plans discussed with the patient, family and they are in agreement.  CODE STATUS: Full code  TOTAL TIME TAKING CARE OF THIS PATIENT: 50 minutes.    Alford Highland M.D on 05/15/2016 at 11:22 AM  Between 7am to 6pm - Pager - [phone]  After 6pm go to [email or web address] - password [email or web address]  [facility]  Office  [phone]  CC: Primary care Physician: Dr. Velora Mediate

## 2016-05-16 LAB — CBC WITH DIFFERENTIAL/PLATELET
BASOS ABS: 0 10*3/uL (ref 0–0.1)
Basophils Relative: 0 %
EOS ABS: 0.1 10*3/uL (ref 0–0.7)
EOS PCT: 1 %
HCT: 30.8 % — ABNORMAL LOW (ref 35.0–47.0)
Hemoglobin: 10.5 g/dL — ABNORMAL LOW (ref 12.0–16.0)
LYMPHS ABS: 3 10*3/uL (ref 1.0–3.6)
LYMPHS PCT: 19 %
MCH: 28.1 pg (ref 26.0–34.0)
MCHC: 34.1 g/dL (ref 32.0–36.0)
MCV: 82.5 fL (ref 80.0–100.0)
MONO ABS: 1.3 10*3/uL — AB (ref 0.2–0.9)
Monocytes Relative: 9 %
Neutro Abs: 10.8 10*3/uL — ABNORMAL HIGH (ref 1.4–6.5)
Neutrophils Relative %: 71 %
PLATELETS: 486 10*3/uL — AB (ref 150–440)
RBC: 3.74 MIL/uL — AB (ref 3.80–5.20)
RDW: 14.3 % (ref 11.5–14.5)
WBC: 15.3 10*3/uL — AB (ref 3.6–11.0)

## 2016-05-16 LAB — POTASSIUM: Potassium: 3.1 mmol/L — ABNORMAL LOW (ref 3.5–5.1)

## 2016-05-16 LAB — TSH: TSH: 3.022 u[IU]/mL (ref 0.350–4.500)

## 2016-05-16 LAB — T4, FREE: Free T4: 0.98 ng/dL (ref 0.61–1.12)

## 2016-05-16 LAB — MAGNESIUM: MAGNESIUM: 1.6 mg/dL — AB (ref 1.7–2.4)

## 2016-05-16 MED ORDER — ONDANSETRON HCL 4 MG/2ML IJ SOLN
4.0000 mg | Freq: Four times a day (QID) | INTRAMUSCULAR | Status: DC | PRN
  Administered 2016-05-16 – 2016-05-18 (×4): 4 mg via INTRAVENOUS
  Filled 2016-05-16 (×4): qty 2

## 2016-05-16 MED ORDER — CALCIUM CARBONATE ANTACID 500 MG PO CHEW
400.0000 mg | CHEWABLE_TABLET | Freq: Three times a day (TID) | ORAL | Status: DC | PRN
Start: 2016-05-16 — End: 2016-05-18
  Administered 2016-05-17: 400 mg via ORAL
  Filled 2016-05-16: qty 2

## 2016-05-16 MED ORDER — POTASSIUM CHLORIDE CRYS ER 20 MEQ PO TBCR
40.0000 meq | EXTENDED_RELEASE_TABLET | ORAL | Status: AC
  Administered 2016-05-16 (×2): 40 meq via ORAL
  Filled 2016-05-16 (×2): qty 2

## 2016-05-16 MED ORDER — MAGNESIUM OXIDE 400 (241.3 MG) MG PO TABS
400.0000 mg | ORAL_TABLET | Freq: Two times a day (BID) | ORAL | Status: DC
  Administered 2016-05-16 – 2016-05-17 (×3): 400 mg via ORAL
  Filled 2016-05-16 (×4): qty 1

## 2016-05-16 NOTE — Progress Notes (Signed)
[facility] - [place] at [facility]   PATIENT NAME: Karen Duarte    MR#:  [id number]  DATE OF BIRTH:  [birth year]  SUBJECTIVE:  CHIEF COMPLAINT:  Patient is resting comfortably. Shortness of breath improved. Still coughing.  REVIEW OF SYSTEMS:  CONSTITUTIONAL: No fever, fatigue or weakness.  EYES: No blurred or double vision.  EARS, NOSE, AND THROAT: No tinnitus or ear pain.  RESPIRATORY: Reports cough, denies shortness of breath, wheezing or hemoptysis.  CARDIOVASCULAR: No chest pain, orthopnea, edema.  GASTROINTESTINAL: No nausea, vomiting, diarrhea or abdominal pain.  GENITOURINARY: No dysuria, hematuria.  ENDOCRINE: No polyuria, nocturia,  HEMATOLOGY: No anemia, easy bruising or bleeding SKIN: No rash or lesion. MUSCULOSKELETAL: No joint pain or arthritis.   NEUROLOGIC: No tingling, numbness, weakness.  PSYCHIATRY: No anxiety or depression.   DRUG ALLERGIES:  No Known Allergies  VITALS:  Blood pressure (!) 103/58, pulse (!) 104, temperature 98.2 F (36.8 C), temperature source Oral, resp. rate 18, height 5\' 3"  (1.6 m), weight 66.7 kg (147 lb), SpO2 98 %.  PHYSICAL EXAMINATION:  GENERAL:  31 y.o.-year-old patient lying in the bed with no acute distress.  EYES: Pupils equal, round, reactive to light and accommodation. No scleral icterus. Extraocular muscles intact.  HEENT: Head atraumatic, normocephalic. Oropharynx and nasopharynx clear.  NECK:  Supple, no jugular venous distention. No thyroid enlargement, no tenderness.  LUNGS: Mod breath sounds bilaterally, no wheezing, rales,rhonchi or crepitation. No use of accessory muscles of respiration.  CARDIOVASCULAR: S1, S2 normal. No murmurs, rubs, or gallops.  ABDOMEN: Soft, nontender, nondistended. Bowel sounds present. No organomegaly or mass.  EXTREMITIES: No pedal edema, cyanosis, or clubbing.  NEUROLOGIC: Cranial nerves II through XII are intact. Muscle strength 5/5 in all extremities. Sensation  intact. Gait not checked.  PSYCHIATRIC: The patient is alert and oriented x 3.  SKIN: No obvious rash, lesion, or ulcer.    LABORATORY PANEL:   CBC  Recent Labs Lab 05/16/16 0615  WBC 15.3*  HGB 10.5*  HCT 30.8*  PLT 486*   ------------------------------------------------------------------------------------------------------------------  Chemistries   Recent Labs Lab 05/14/16 1629 05/16/16 0615  NA 134*  --   K 3.3* 3.1*  CL 102  --   CO2 21*  --   GLUCOSE 97  --   BUN 5*  --   CREATININE 0.42*  --   CALCIUM 8.5*  --   MG  --  1.6*  AST 26  --   ALT 22  --   ALKPHOS 166*  --   BILITOT 0.9  --    ------------------------------------------------------------------------------------------------------------------  Cardiac Enzymes No results for input(s): TROPONINI in the last 168 hours. ------------------------------------------------------------------------------------------------------------------  RADIOLOGY:  Dg Chest 2 View  Result Date: 05/14/2016 CLINICAL DATA:  Productive cough EXAM: CHEST  2 VIEW COMPARISON:  None. FINDINGS: Lungs are clear. Heart size and pulmonary vascularity are normal. No adenopathy. No bone lesions. IMPRESSION: No edema or consolidation. Electronically Signed   By: Bretta Bang III M.D.   On: 05/14/2016 16:54   Ct Angio Chest Pe W/cm &/or Wo Cm  Result Date: 05/14/2016 CLINICAL DATA:  Initial evaluation for hemoptysis, elevated D-dimer, cough. EXAM: CT ANGIOGRAPHY CHEST WITH CONTRAST TECHNIQUE: Multidetector CT imaging of the chest was performed using the standard protocol during bolus administration of intravenous contrast. Multiplanar CT image reconstructions and MIPs were obtained to evaluate the vascular anatomy. CONTRAST:  75 cc of Isovue 370. COMPARISON:  Prior radiograph from earlier the same day. FINDINGS: Cardiovascular: Intrathoracic aorta of  normal caliber and appearance without acute abnormality. Visualized great vessels  within normal limits. Heart size normal.  No pericardial effusion. Pulmonary arterial tree adequately opacified for evaluation. Main pulmonary artery measures within normal limits for diameter at 2.8 cm. No filling defect to suggest acute pulmonary embolism identified. Re-formatted imaging confirms these findings. Mediastinum/Nodes: Visualized thyroid is normal. No pathologically enlarged mediastinal lymph nodes identified. Mildly prominent right hilar node measures 12 mm, which may be reactive. No left hilar adenopathy. No axillary adenopathy. Esophagus within normal limits. Lungs/Pleura: Patchy multifocal ground-glass opacities present within the right middle lobe and lingula, suspicious for possible infection. Additional multifocal ground-glass opacities within the bilateral upper lobes and lower lobes, greater on the right, also concerning for possible multifocal infection. No pulmonary edema or pleural effusion. No pneumothorax. No worrisome pulmonary nodule or mass. Upper Abdomen: Visualized upper abdomen unremarkable. Musculoskeletal: No acute osseus abnormality. No worrisome lytic or blastic osseous lesions. Review of the MIP images confirms the above findings. IMPRESSION: 1. No CT evidence for acute pulmonary embolism. 2. Multifocal ground-glass opacities involving the bilateral lungs, most notable within the right middle lobe and lingula. Findings most suspicious for multifocal infection/pneumonia. 3. Mildly enlarged 12 mm right hilar lymph node, likely reactive. Electronically Signed   By: Rise Mu M.D.   On: 05/14/2016 21:26   US Ob Comp + 14 Wk  Result Date: 05/15/2016 CLINICAL DATA:  Small for dates EXAM: OBSTETRICAL ULTRASOUND >14 WKS FINDINGS: Number of Fetuses: 1 Heart Rate:  130 bpm Movement: Present Presentation: Cephalic Previa: None Placental Location: Anterior and partially fundal. The distance from the lower placental segment to the internal cervical os is 10 cm. Amniotic Fluid  (Subjective): Decreased Amniotic Fluid (Objective):  Decreased AFI 6.8 cm cm (5%ile= 7.9 cm, 95%= 24.9 cm for 35 wks) FETAL BIOMETRY BPD:  8.5cm 34w 2d HC:    30.5cm  34w   0d AC:   3.7cm  35w   5d FL:   6.5cm  33w   2d Current Mean GA: 33w 6d              [place] EDC: June 27, 2016 Estimated Fetal Weight:  2497g    21%ile FETAL ANATOMY Lateral Ventricles: Visualized Thalami/CSP: Visualized Posterior Fossa:  Visualized Nuchal Region: Visualized Upper Lip: Visualized Spine: Visualized 4 Chamber Heart on Left: Visualized LVOT: Visualized RVOT: Visualized Stomach on Left: Visualized 3 Vessel Cord: Visualized Cord Insertion site: Visualized Kidneys: Visualized Bladder: Visualized Extremities: Visualized Sex: Female genitalia Technically difficult due to: Fetal position Maternal Findings: Cervix:  3.9 cm and closed The patient removal rib ports coughing due to pneumonia. When she coughs she either leaks a small amount of urine or is leaking amniotic fluid. IMPRESSION: Single viable IUP with cephalic presentation and anterior partially fundal placenta. No fetal anomalies are observed. The estimated gestational age is 37 weeks 6 days with estimated date of confinement of June 27, 2016. This is approximally 12 days less mature than the day predicted on clinical grounds. The fetal weight is at the twenty-first percentile for [redacted] week gestation. The amniotic fluid volume is low with the amniotic fluid index below the fifth percentile. The patient reports leaking either urine or amniotic fluid when she coughs. She has been suffering from pneumonia. Electronically Signed   By: David  [place] M.D.   On: 05/15/2016 15:45   US Venous Img Lower Bilateral  Result Date: 05/14/2016 CLINICAL DATA:  Shortness of breath with hemoptysis. Third trimester gestation. EXAM: BILATERAL LOWER EXTREMITY VENOUS  DUPLEX ULTRASOUND TECHNIQUE: Gray-scale sonography with graded compression, as well as color Doppler and duplex ultrasound were performed  to evaluate the lower extremity deep venous systems from the level of the common femoral vein and including the common femoral, femoral, profunda femoral, popliteal and calf veins including the posterior tibial, peroneal and gastrocnemius veins when visible. The superficial great saphenous vein was also interrogated. Spectral Doppler was utilized to evaluate flow at rest and with distal augmentation maneuvers in the common femoral, femoral and popliteal veins. COMPARISON:  None. FINDINGS: RIGHT LOWER EXTREMITY Common Femoral Vein: No evidence of thrombus. Normal compressibility, respiratory phasicity and response to augmentation. Saphenofemoral Junction: No evidence of thrombus. Normal compressibility and flow on color Doppler imaging. Profunda Femoral Vein: No evidence of thrombus. Normal compressibility and flow on color Doppler imaging. Femoral Vein: No evidence of thrombus. Normal compressibility, respiratory phasicity and response to augmentation. Popliteal Vein: No evidence of thrombus. Normal compressibility, respiratory phasicity and response to augmentation. Calf Veins: No evidence of thrombus. Normal compressibility and flow on color Doppler imaging. Superficial Great Saphenous Vein: No evidence of thrombus. Normal compressibility and flow on color Doppler imaging. Venous Reflux:  None. Other Findings:  Slow lower extremity venous blood flow noted. LEFT LOWER EXTREMITY Common Femoral Vein: No evidence of thrombus. Normal compressibility, respiratory phasicity and response to augmentation. Saphenofemoral Junction: No evidence of thrombus. Normal compressibility and flow on color Doppler imaging. Profunda Femoral Vein: No evidence of thrombus. Normal compressibility and flow on color Doppler imaging. Femoral Vein: No evidence of thrombus. Normal compressibility, respiratory phasicity and response to augmentation. Popliteal Vein: No evidence of thrombus. Normal compressibility, respiratory phasicity and  response to augmentation. Calf Veins: No evidence of thrombus. Normal compressibility and flow on color Doppler imaging. Superficial Great Saphenous Vein: No evidence of thrombus. Normal compressibility and flow on color Doppler imaging. Venous Reflux:  None. Other Findings:  Slow lower extremity venous blood flow noted. IMPRESSION: No evidence of deep venous thrombosis in either lower extremity. Slow/Rouleaux type flow noted in each lower extremity. Clinical significance of this finding uncertain. Electronically Signed   By: Bretta Bang III M.D.   On: 05/14/2016 21:03    EKG:   Orders placed or performed during the hospital encounter of 05/14/16  . EKG 12-Lead  . EKG 12-Lead  . EKG 12-Lead  . EKG 12-Lead  . EKG 12-Lead  . EKG 12-Lead    ASSESSMENT AND PLAN:   #Sepsis present on admission with tachycardia and leukocytosis and pneumonia Clinically improving with IV Rocephin and azithromycin will continue the same. At the time of discharge will provide azithromycin by mouth and Augmentin Nebulizer treatments as needed  #Acute bronchoconstriction from sepsis from pneumonia Patient is improving with IV steroids will taper steroids Continue nebulizer treatments   #acute otitis media bilateral continue current antibiotics IV Rocephin    #hypokalemia and hypomagnesemia   replete and recheck in a.m.    #neck tenderness in the area of thyroid TSH and free T4 nml      All the records are reviewed and case discussed with Care Management/Social Workerr. Management plans discussed with the patient, family and they are in agreement.  CODE STATUS: fc   TOTAL TIME TAKING CARE OF THIS PATIENT: 33  minutes.    Note: This dictation was prepared with Dragon dictation along with smaller phrase technology. Any transcriptional errors that result from this process are unintentional.   Ramonita Lab M.D on 05/16/2016 at 2:41 PM  Between 7am to 6pm -  Pager - [phone] After 6pm go to  [email or web address] - password [email or web address]  [facility]  Office  [phone]  CC: Primary care physician; No PCP Per Patient

## 2016-05-16 NOTE — Progress Notes (Signed)
NST at 12:15 today reactive/ Category I  130 bpm, moderate variability, +accelerations, -decelerations  Austina Constantin, CNM

## 2016-05-16 NOTE — Progress Notes (Signed)
Patient complaining of "tingling" through her right arm. She believes it is from the IV infusing in that arm. IV appears to be infusing well. However new IV started in left arm and fluids restarted on left side. Will continue to monitor tingling, especially given patient's recent electrolyte imbalances.  Imagene ShellerMegan Haleigh Desmith, RN

## 2016-05-16 NOTE — Progress Notes (Signed)
Karen Duarte  Admission Date: 05/15/2016 Current Date: 05/16/2016  Karen ShinerChelsea L Roussel is a 31 y.o. [id number] HD#1 @ 6455w5d, admitted for fever/malaise/cough with dx of PNEUMONIA .  History complicated by: Patient Active Problem List   Diagnosis Date Noted  . Pneumonia affecting pregnancy in third trimester 05/14/2016   ROS and patient/family/surgical history, located on admission H&P Duarte dated 05/15/2016, have been reviewed, and there are no changes except as noted below  Yesterday/Overnight Events:  Still has cough. Receiving nebulizer treatments, as well as ABX.  Subjective:  Woke up this morning feeling worse than yesterday with tightness/pain in her chest, cough and upper airway congestion. Has been ambulating in the room and to the bathroom. Pt admits drinking adequate h2o and she has an appetite.  Objective:   Vitals:   05/16/16 0140 05/16/16 0334 05/16/16 0715 05/16/16 0726  BP:  112/66 116/71   Pulse:  (!) 106 100   Resp:  18 18   Temp:  97.8 F (36.6 C) 98.5 F (36.9 C)   TempSrc:  Oral Oral   SpO2: 99% 98% 100% 100%  Weight:      Height:        Intake/Output Summary (Last 24 hours) at 05/16/16 0850 Last data filed at 05/16/16 [id number]  Gross per 24 hour  Intake                0 ml  Output              430 ml  Net             -430 ml   Physical exam: General appearance: alert, cooperative, mild distress Abdomen: Vtx, gravid, NT GU: No gross VB Lungs: rhonchi bilaterally Heart: regular rate and rhythm and no MRGs Extremities: no redness or tenderness in the calves or thighs, no edema Skin: no lesions Psych: appropriate   Recent Labs Lab 05/14/16 1629 05/16/16 0615  NA 134*  --   K 3.3* 3.1*  CL 102  --   CO2 21*  --   BUN 5*  --   CREATININE 0.42*  --   GLUCOSE 97  --     Recent Labs Lab 05/14/16 1629 05/16/16 0615  WBC 17.2* 15.3*  HGB 12.2 10.5*  HCT 36.5 30.8*  PLT 479* 486*     Recent Labs Lab 05/14/16 1629  05/16/16 0615  CALCIUM 8.5*  --   MG  --  1.6*   No results for input(s): INR, APTT in the last 168 hours.     Recent Labs Lab 05/14/16 1629  ALKPHOS 166*  BILITOT 0.9  PROT 7.0  ALT 22  AST 26     Assessment & Plan:  [redacted] weeks pregnant Pneumonia Prior flu, s/p Tamiflu  Continue Azithromycin and Rocephin IV Nebulizer treatments Management of sx's Monitor Fetal Well Being daily Diet, Amb Disposition: continue inpatient and will reevaluate for DC after today's treatments   Makenzy Krist, CNM

## 2016-05-16 NOTE — Progress Notes (Addendum)
Obstetric and Gynecology  Subjective  Feels slightly more tired than yesterday, otherwise no significant change.  Still with non-productive cough.    Objective   Vitals:   05/16/16 0334 05/16/16 0715  BP: 112/66 116/71  Pulse: (!) 106 100  Resp: 18 18  Temp: 97.8 F (36.6 C) 98.5 F (36.9 C)     Intake/Output Summary (Last 24 hours) at 05/16/16 0946 Last data filed at 05/16/16 [id number]  Gross per 24 hour  Intake                0 ml  Output              250 ml  Net             -250 ml    General: NAD Lungs: No increased work of breathing or accessory muscle use.  Good air movement, still with some rhonchi R>L Cardiovascular: RRR Abdomen: gravid, non-tender Extremities: no edema, erythema, or tenderness  Labs: Results for orders placed or performed during the hospital encounter of 05/15/16 (from the past 24 hour(s))  Culture, expectorated sputum-assessment     Status: None   Collection Time: 05/15/16  8:10 PM  Result Value Ref Range   Specimen Description Expect. Sput    Special Requests Normal    Sputum evaluation THIS SPECIMEN IS ACCEPTABLE FOR SPUTUM CULTURE    Report Status 05/15/2016 FINAL   Culture, respiratory (NON-Expectorated)     Status: None (Preliminary result)   Collection Time: 05/15/16  8:10 PM  Result Value Ref Range   Specimen Description Expect. Sput    Special Requests Normal Reflexed from [id number]    Gram Stain      MODERATE WBC PRESENT, PREDOMINANTLY PMN MODERATE GRAM POSITIVE COCCI IN PAIRS RARE GRAM NEGATIVE RODS RARE GRAM POSITIVE RODS Performed at [facility], [address] [place], [place] [id number]    Culture PENDING    Report Status PENDING   CBC with Differential/Platelet     Status: Abnormal   Collection Time: 05/16/16  6:15 AM  Result Value Ref Range   WBC 15.3 (H) 3.6 - 11.0 K/uL   RBC 3.74 (L) 3.80 - 5.20 MIL/uL   Hemoglobin 10.5 (L) 12.0 - 16.0 g/dL   HCT 13.030.8 (L) 86.535.0 - 78.447.0 %   MCV 82.5 80.0 - 100.0 fL   MCH 28.1 26.0  - 34.0 pg   MCHC 34.1 32.0 - 36.0 g/dL   RDW 69.614.3 29.511.5 - 28.414.5 %   Platelets 486 (H) 150 - 440 K/uL   Neutrophils Relative % 71 %   Neutro Abs 10.8 (H) 1.4 - 6.5 K/uL   Lymphocytes Relative 19 %   Lymphs Abs 3.0 1.0 - 3.6 K/uL   Monocytes Relative 9 %   Monocytes Absolute 1.3 (H) 0.2 - 0.9 K/uL   Eosinophils Relative 1 %   Eosinophils Absolute 0.1 0 - 0.7 K/uL   Basophils Relative 0 %   Basophils Absolute 0.0 0 - 0.1 K/uL  TSH     Status: None   Collection Time: 05/16/16  6:15 AM  Result Value Ref Range   TSH 3.022 0.350 - 4.500 uIU/mL  T4, free     Status: None   Collection Time: 05/16/16  6:15 AM  Result Value Ref Range   Free T4 0.98 0.61 - 1.12 ng/dL  Potassium     Status: Abnormal   Collection Time: 05/16/16  6:15 AM  Result Value Ref Range   Potassium 3.1 (L)  3.5 - 5.1 mmol/L  Magnesium     Status: Abnormal   Collection Time: 05/16/16  6:15 AM  Result Value Ref Range   Magnesium 1.6 (L) 1.7 - 2.4 mg/dL    Cultures: Results for orders placed or performed during the hospital encounter of 05/15/16  Culture, expectorated sputum-assessment     Status: None   Collection Time: 05/15/16  8:10 PM  Result Value Ref Range Status   Specimen Description Expect. Sput  Final   Special Requests Normal  Final   Sputum evaluation THIS SPECIMEN IS ACCEPTABLE FOR SPUTUM CULTURE  Final   Report Status 05/15/2016 FINAL  Final  Culture, respiratory (NON-Expectorated)     Status: None (Preliminary result)   Collection Time: 05/15/16  8:10 PM  Result Value Ref Range Status   Specimen Description Expect. Sput  Final   Special Requests Normal Reflexed from [id number]  Final   Gram Stain   Final    MODERATE WBC PRESENT, PREDOMINANTLY PMN MODERATE GRAM POSITIVE COCCI IN PAIRS RARE GRAM NEGATIVE RODS RARE GRAM POSITIVE RODS Performed at [facility], [address] [place], [place] [address]    Culture PENDING  Incomplete   Report Status PENDING  Incomplete    US Ob Comp + 14  Wk  Result Date: 05/15/2016 CLINICAL DATA:  Small for dates EXAM: OBSTETRICAL ULTRASOUND >14 WKS FINDINGS: Number of Fetuses: 1 Heart Rate:  130 bpm Movement: Present Presentation: Cephalic Previa: None Placental Location: Anterior and partially fundal. The distance from the lower placental segment to the internal cervical os is 10 cm. Amniotic Fluid (Subjective): Decreased Amniotic Fluid (Objective):  Decreased AFI 6.8 cm cm (5%ile= 7.9 cm, 95%= 24.9 cm for 35 wks) FETAL BIOMETRY BPD:  8.5cm 34w 2d HC:    30.5cm  34w   0d AC:   3.7cm  35w   5d FL:   6.5cm  33w   2d Current Mean GA: 33w 6d              [place] EDC: June 27, 2016 Estimated Fetal Weight:  2497g    21%ile FETAL ANATOMY Lateral Ventricles: Visualized Thalami/CSP: Visualized Posterior Fossa:  Visualized Nuchal Region: Visualized Upper Lip: Visualized Spine: Visualized 4 Chamber Heart on Left: Visualized LVOT: Visualized RVOT: Visualized Stomach on Left: Visualized 3 Vessel Cord: Visualized Cord Insertion site: Visualized Kidneys: Visualized Bladder: Visualized Extremities: Visualized Sex: Female genitalia Technically difficult due to: Fetal position Maternal Findings: Cervix:  3.9 cm and closed The patient removal rib ports coughing due to pneumonia. When she coughs she either leaks a small amount of urine or is leaking amniotic fluid. IMPRESSION: Single viable IUP with cephalic presentation and anterior partially fundal placenta. No fetal anomalies are observed. The estimated gestational age is 33 weeks 6 days with estimated date of confinement of June 27, 2016. This is approximally 12 days less mature than the day predicted on clinical grounds. The fetal weight is at the twenty-first percentile for [redacted] week gestation. The amniotic fluid volume is low with the amniotic fluid index below the fifth percentile. The patient reports leaking either urine or amniotic fluid when she coughs. She has been suffering from pneumonia. Electronically Signed   By: David   [place] M.D.   On: 05/15/2016 15:45     Assessment   31 y.o. [id number] at [redacted]w[redacted]d admitted for PNA  Plan   1) PNA - appreciate medicine input.  CXR negative CT with multifocal PNA.   - continue azithromycin and ceftriaxone - continue  solumedrol and nebulizer treatments - influenza negative 2/13 - monitor for symptomatic improvement, afebrile last 24-hrs and oxygenating at 98-100% on RA - WBC count down slightly since admission.  Mild elevation to be expected in pregnancy was 12.3 at time of 28 week labs.  In addition solumedrol may cause some further increase in WBC so may not be the most reliable indicator to follow  2) Fetus - appropriate growth at 21%ile - AFI low normal at 6.8cm - daily NST's  3) FEN - continue IV hydration and general diet  4) DVT ppx - ambulatory

## 2016-05-17 LAB — BASIC METABOLIC PANEL
ANION GAP: 6 (ref 5–15)
BUN: <5 mg/dL — ABNORMAL LOW (ref 6–20)
CALCIUM: 8.2 mg/dL — AB (ref 8.9–10.3)
CO2: 22 mmol/L (ref 22–32)
Chloride: 109 mmol/L (ref 101–111)
Creatinine, Ser: 0.49 mg/dL (ref 0.44–1.00)
Glucose, Bld: 77 mg/dL (ref 65–99)
POTASSIUM: 3.4 mmol/L — AB (ref 3.5–5.1)
SODIUM: 137 mmol/L (ref 135–145)

## 2016-05-17 LAB — CBC
HCT: 28.7 % — ABNORMAL LOW (ref 35.0–47.0)
Hemoglobin: 9.7 g/dL — ABNORMAL LOW (ref 12.0–16.0)
MCH: 27.9 pg (ref 26.0–34.0)
MCHC: 34 g/dL (ref 32.0–36.0)
MCV: 82.1 fL (ref 80.0–100.0)
PLATELETS: 497 10*3/uL — AB (ref 150–440)
RBC: 3.49 MIL/uL — AB (ref 3.80–5.20)
RDW: 14.4 % (ref 11.5–14.5)
WBC: 12.1 10*3/uL — AB (ref 3.6–11.0)

## 2016-05-17 LAB — MAGNESIUM: MAGNESIUM: 1.7 mg/dL (ref 1.7–2.4)

## 2016-05-17 LAB — T3, FREE: T3 FREE: 3.2 pg/mL (ref 2.0–4.4)

## 2016-05-17 MED ORDER — MAGNESIUM SULFATE IN D5W 1-5 GM/100ML-% IV SOLN
1.0000 g | Freq: Once | INTRAVENOUS | Status: AC
  Administered 2016-05-17: 1 g via INTRAVENOUS
  Filled 2016-05-17: qty 100

## 2016-05-17 MED ORDER — AZITHROMYCIN 250 MG PO TABS
250.0000 mg | ORAL_TABLET | Freq: Every day | ORAL | Status: DC
  Administered 2016-05-17 – 2016-05-18 (×2): 250 mg via ORAL
  Filled 2016-05-17 (×3): qty 1

## 2016-05-17 MED ORDER — PREDNISONE 50 MG PO TABS
50.0000 mg | ORAL_TABLET | Freq: Every day | ORAL | Status: DC
  Administered 2016-05-18: 50 mg via ORAL
  Filled 2016-05-17: qty 1

## 2016-05-17 MED ORDER — POTASSIUM CHLORIDE CRYS ER 20 MEQ PO TBCR
40.0000 meq | EXTENDED_RELEASE_TABLET | ORAL | Status: AC
  Administered 2016-05-17 (×2): 40 meq via ORAL
  Filled 2016-05-17 (×2): qty 2

## 2016-05-17 MED ORDER — MAGNESIUM SULFATE BOLUS VIA INFUSION
1.0000 g | Freq: Once | INTRAVENOUS | Status: DC
  Filled 2016-05-17: qty 500

## 2016-05-17 NOTE — Progress Notes (Signed)
Benign Gynecology Progress Note  Admission Date: 05/15/2016 Current Date: 05/17/2016  Karen Duarte is a 31 y.o. [id number] HD#1 @ 7318w6d, admitted for fever/malaise/cough with dx of PNEUMONIA .  History complicated by: Patient Active Problem List   Diagnosis Date Noted  . Pneumonia affecting pregnancy in third trimester 05/14/2016   ROS and patient/family/surgical history, located on admission H&P note dated 05/15/2016, have been reviewed, and there are no changes except as noted below  Yesterday/Overnight Events:  Still has cough. Receiving nebulizer treatments, as well as ABX.  Subjective:  Pt is feeling better this morning. She just finished a breathing treatment. She has pain in her left side and ribs today probably from coughing. Has been ambulating in the room and to the bathroom. Pt admits drinking adequate h2o and she has an appetite. She would like to go home but worries about a relapse.   Objective:   Vitals:   05/17/16 0209 05/17/16 0444 05/17/16 0734 05/17/16 0847  BP:  (!) 100/54 (!) 109/58   Pulse:  (!) 114 (!) 110   Resp:  18 18   Temp:  98.8 F (37.1 C) 98 F (36.7 C)   TempSrc:  Oral Oral   SpO2: 99% 97% 99% 99%  Weight:      Height:        Physical exam: General appearance: alert, cooperative, mild distress Abdomen: Vtx, gravid, NT GU: No gross VB Lungs: B clear to auscultation Heart: regular rate and rhythm and no MRGs Extremities: no redness or tenderness in the calves or thighs, no edema Skin: no lesions Psych: appropriate   Recent Labs Lab 05/14/16 1629 05/16/16 0615 05/17/16 0645  NA 134*  --  137  K 3.3* 3.1* 3.4*  CL 102  --  109  CO2 21*  --  22  BUN 5*  --  <5*  CREATININE 0.42*  --  0.49  GLUCOSE 97  --  77    Recent Labs Lab 05/14/16 1629 05/16/16 0615 05/17/16 0645  WBC 17.2* 15.3* 12.1*  HGB 12.2 10.5* 9.7*  HCT 36.5 30.8* 28.7*  PLT 479* 486* 497*     Recent Labs Lab 05/14/16 1629 05/16/16 0615 05/17/16 0645   CALCIUM 8.5*  --  8.2*  MG  --  1.6* 1.7   No results for input(s): INR, APTT in the last 168 hours.     Recent Labs Lab 05/14/16 1629  ALKPHOS 166*  BILITOT 0.9  PROT 7.0  ALT 22  AST 26     Assessment & Plan:  [redacted] weeks pregnant Pneumonia Prior flu, s/p Tamiflu  Continue Azithromycin and Rocephin IV Nebulizer treatments Management of sx's Monitor Fetal Well Being daily Diet, Amb Disposition: continue inpatient    Oneisha Ammons, CNM

## 2016-05-17 NOTE — Progress Notes (Signed)
[facility] [place] [facility]   PATIENT NAME: Karen RogersChelsea Duarte    MR#:  [id number]  DATE OF BIRTH:  06/26/1985  SUBJECTIVE:  CHIEF COMPLAINT:  Patient isClinically feeling better Still coughing.  REVIEW OF SYSTEMS:  CONSTITUTIONAL: No fever, fatigue or weakness.  EYES: No blurred or double vision.  EARS, NOSE, AND THROAT: No tinnitus or ear pain.  RESPIRATORY: Reports cough, denies shortness of breath, wheezing or hemoptysis.  CARDIOVASCULAR: No chest pain, orthopnea, edema.  GASTROINTESTINAL: No nausea, vomiting, diarrhea or abdominal pain.  GENITOURINARY: No dysuria, hematuria.  ENDOCRINE: No polyuria, nocturia,  HEMATOLOGY: No anemia, easy bruising or bleeding SKIN: No rash or lesion. MUSCULOSKELETAL: No joint pain or arthritis.   NEUROLOGIC: No tingling, numbness, weakness.  PSYCHIATRY: No anxiety or depression.   DRUG ALLERGIES:  No Known Allergies  VITALS:  Blood pressure 112/84, pulse 98, temperature 97.9 F (36.6 C), temperature source Oral, resp. rate 20, height 5\' 3"  (1.6 m), weight 66.7 kg (147 lb), SpO2 99 %.  PHYSICAL EXAMINATION:  GENERAL:  31 y.o.-year-old patient lying in the bed with no acute distress.  EYES: Pupils equal, round, reactive to light and accommodation. No scleral icterus. Extraocular muscles intact.  HEENT: Head atraumatic, normocephalic. Oropharynx and nasopharynx clear.  NECK:  Supple, no jugular venous distention. No thyroid enlargement, no tenderness.  LUNGS: Mod breath sounds bilaterally, no wheezing, rales,rhonchi or crepitation. No use of accessory muscles of respiration.  CARDIOVASCULAR: S1, S2 normal. No murmurs, rubs, or gallops.  ABDOMEN: Soft, nontender, nondistended. Bowel sounds present. No organomegaly or mass.  EXTREMITIES: No pedal edema, cyanosis, or clubbing.  NEUROLOGIC: Cranial nerves II through XII are intact. Muscle strength 5/5 in all extremities. Sensation intact. Gait not checked.   PSYCHIATRIC: The patient is alert and oriented x 3.  SKIN: No obvious rash, lesion, or ulcer.    LABORATORY PANEL:   CBC  Recent Labs Lab 05/17/16 0645  WBC 12.1*  HGB 9.7*  HCT 28.7*  PLT 497*   ------------------------------------------------------------------------------------------------------------------  Chemistries   Recent Labs Lab 05/14/16 1629  05/17/16 0645  NA 134*  --  137  K 3.3*  < > 3.4*  CL 102  --  109  CO2 21*  --  22  GLUCOSE 97  --  77  BUN 5*  --  <5*  CREATININE 0.42*  --  0.49  CALCIUM 8.5*  --  8.2*  MG  --   < > 1.7  AST 26  --   --   ALT 22  --   --   ALKPHOS 166*  --   --   BILITOT 0.9  --   --   < > = values in this interval not displayed. ------------------------------------------------------------------------------------------------------------------  Cardiac Enzymes No results for input(s): TROPONINI in the last 168 hours. ------------------------------------------------------------------------------------------------------------------  RADIOLOGY:  No results found.  EKG:   Orders placed or performed during the hospital encounter of 05/14/16  . EKG 12-Lead  . EKG 12-Lead  . EKG 12-Lead  . EKG 12-Lead  . EKG 12-Lead  . EKG 12-Lead    ASSESSMENT AND PLAN:   #Sepsis present on admission with tachycardia and leukocytosis and pneumonia Clinically improving Clinically improving with IV Rocephin and azithromycin will continue the same. At the time of discharge will provide azithromycin by mouth and Augmentin Nebulizer treatments as needed  #Acute bronchoconstriction from sepsis from pneumonia Patient is improving with IV steroids will taper to by mouth he steroids Continue nebulizer treatments   #  acute otitis media bilateral continue current antibiotics IV Rocephin    #hypokalemia and hypomagnesemia   replete and recheck in a.m.    #neck tenderness in the area of thyroid TSH and free T4 nml      All the  records are reviewed and case discussed with Care Management/Social Workerr. Management plans discussed with the patient, family and they are in agreement.  CODE STATUS: fc   TOTAL TIME TAKING CARE OF THIS PATIENT: 33  minutes.    Note: This dictation was prepared with Dragon dictation along with smaller phrase technology. Any transcriptional errors that result from this process are unintentional.   Ramonita Lab M.D on 05/17/2016 at 4:55 PM  Between 7am to 6pm - Pager - [phone] After 6pm go to [email or web address] - password [email or web address] [facility]  [facility]  Office  [phone]  CC: Primary care physician; No PCP Per Patient

## 2016-05-18 LAB — CBC WITH DIFFERENTIAL/PLATELET
BASOS ABS: 0 10*3/uL (ref 0–0.1)
BASOS PCT: 0 %
EOS ABS: 0.2 10*3/uL (ref 0–0.7)
EOS PCT: 2 %
HCT: 30.7 % — ABNORMAL LOW (ref 35.0–47.0)
Hemoglobin: 10.6 g/dL — ABNORMAL LOW (ref 12.0–16.0)
Lymphocytes Relative: 34 %
Lymphs Abs: 3.2 10*3/uL (ref 1.0–3.6)
MCH: 28.2 pg (ref 26.0–34.0)
MCHC: 34.6 g/dL (ref 32.0–36.0)
MCV: 81.4 fL (ref 80.0–100.0)
MONO ABS: 0.7 10*3/uL (ref 0.2–0.9)
Monocytes Relative: 7 %
Neutro Abs: 5.5 10*3/uL (ref 1.4–6.5)
Neutrophils Relative %: 57 %
PLATELETS: 562 10*3/uL — AB (ref 150–440)
RBC: 3.77 MIL/uL — ABNORMAL LOW (ref 3.80–5.20)
RDW: 14.8 % — AB (ref 11.5–14.5)
WBC: 9.7 10*3/uL (ref 3.6–11.0)

## 2016-05-18 LAB — CULTURE, RESPIRATORY: CULTURE: NORMAL

## 2016-05-18 LAB — MAGNESIUM: Magnesium: 1.9 mg/dL (ref 1.7–2.4)

## 2016-05-18 LAB — CULTURE, RESPIRATORY W GRAM STAIN: Special Requests: NORMAL

## 2016-05-18 LAB — BASIC METABOLIC PANEL
ANION GAP: 7 (ref 5–15)
BUN: <5 mg/dL — ABNORMAL LOW (ref 6–20)
CALCIUM: 8.5 mg/dL — AB (ref 8.9–10.3)
CO2: 24 mmol/L (ref 22–32)
Chloride: 105 mmol/L (ref 101–111)
Creatinine, Ser: 0.46 mg/dL (ref 0.44–1.00)
GLUCOSE: 82 mg/dL (ref 65–99)
Potassium: 4.3 mmol/L (ref 3.5–5.1)
SODIUM: 136 mmol/L (ref 135–145)

## 2016-05-18 MED ORDER — GUAIFENESIN-CODEINE 100-10 MG/5ML PO SOLN: 10.0000 mL | ORAL | 0 refills | Status: DC | PRN

## 2016-05-18 MED ORDER — ALBUTEROL SULFATE HFA 108 (90 BASE) MCG/ACT IN AERS: 2.0000 | INHALATION_SPRAY | Freq: Four times a day (QID) | RESPIRATORY_TRACT | 2 refills | Status: DC | PRN

## 2016-05-18 MED ORDER — PREDNISONE 10 MG PO TABS: ORAL_TABLET | ORAL | 0 refills | Status: DC

## 2016-05-18 MED ORDER — AMOXICILLIN-POT CLAVULANATE 875-125 MG PO TABS: 1.0000 | ORAL_TABLET | Freq: Two times a day (BID) | ORAL | 0 refills | Status: AC

## 2016-05-18 MED ORDER — AZITHROMYCIN 250 MG PO TABS: 250.0000 mg | ORAL_TABLET | Freq: Every day | ORAL | 0 refills | Status: AC

## 2016-05-18 MED ORDER — ONDANSETRON 4 MG PO TBDP: 4.0000 mg | ORAL_TABLET | Freq: Four times a day (QID) | ORAL | 0 refills | Status: DC | PRN

## 2016-05-18 NOTE — Progress Notes (Signed)
D/C instructions provided, pt states understanding, aware of follow up appt.  Prescriptions given to pt.  D/C home to car via auxiliary in wheelchair.  

## 2016-05-18 NOTE — Discharge Summary (Addendum)
Physician Discharge Summary  Patient ID: INFANTOF VILLAGOMEZ MRN: [id number] DOB/AGE: Aug 29, 1985 29 y.o.  Admit date: 05/15/2016 Discharge date: 05/18/2016  Admission Diagnoses: Pneumonia  Discharge Diagnoses:  Active Problems:   Pneumonia affecting pregnancy in third trimester   Discharged Condition: good  Hospital Course: Patient admitted with multifocal pneumonia on 05/15/2015, initially meeting SIRS criteria based on tachycardia, hypotension, febrile, with elevated WBC.  Patient started on cerftriaxone and azithromycin with scheduled nebulizer treatments.  Seen by medicine and had IV solumedrol added to her regimen.  Patient continued to improve on this regimen, fever curve broke.  At the time of discharge was saturating fine on room air, had been afebrile for >48-hrs, and was reporting feeling better.  She was discharged on and additional 5 days of azithromycin and augmenting for total treatment course of 10 days.  Will finish a prednisone taper as well.  Consults: Medicine  Significant Diagnostic Studies:  Results for orders placed or performed during the hospital encounter of 05/15/16 (from the past 72 hour(s))  Culture, expectorated sputum-assessment     Status: None   Collection Time: 05/15/16  8:10 PM  Result Value Ref Range   Specimen Description Expect. Sput    Special Requests Normal    Sputum evaluation THIS SPECIMEN IS ACCEPTABLE FOR SPUTUM CULTURE    Report Status 05/15/2016 FINAL   Culture, respiratory (NON-Expectorated)     Status: None   Collection Time: 05/15/16  8:10 PM  Result Value Ref Range   Specimen Description Expect. Sput    Special Requests Normal Reflexed from [id number]    Gram Stain      MODERATE WBC PRESENT, PREDOMINANTLY PMN MODERATE GRAM POSITIVE COCCI IN PAIRS RARE GRAM NEGATIVE RODS RARE GRAM POSITIVE RODS    Culture      Consistent with normal respiratory flora. Performed at [facility], [place] [address], [place], [place] [address]     Report Status 05/18/2016 FINAL   CBC with Differential/Platelet     Status: Abnormal   Collection Time: 05/16/16  6:15 AM  Result Value Ref Range   WBC 15.3 (H) 3.6 - 11.0 K/uL   RBC 3.74 (L) 3.80 - 5.20 MIL/uL   Hemoglobin 10.5 (L) 12.0 - 16.0 g/dL   HCT 30.8 (L) 35.0 - 47.0 %   MCV 82.5 80.0 - 100.0 fL   MCH 28.1 26.0 - 34.0 pg   MCHC 34.1 32.0 - 36.0 g/dL   RDW 14.3 11.5 - 14.5 %   Platelets 486 (H) 150 - 440 K/uL   Neutrophils Relative % 71 %   Neutro Abs 10.8 (H) 1.4 - 6.5 K/uL   Lymphocytes Relative 19 %   Lymphs Abs 3.0 1.0 - 3.6 K/uL   Monocytes Relative 9 %   Monocytes Absolute 1.3 (H) 0.2 - 0.9 K/uL   Eosinophils Relative 1 %   Eosinophils Absolute 0.1 0 - 0.7 K/uL   Basophils Relative 0 %   Basophils Absolute 0.0 0 - 0.1 K/uL  TSH     Status: None   Collection Time: 05/16/16  6:15 AM  Result Value Ref Range   TSH 3.022 0.350 - 4.500 uIU/mL    Comment: Performed by a 3rd Generation assay with a functional sensitivity of <=0.01 uIU/mL.  T4, free     Status: None   Collection Time: 05/16/16  6:15 AM  Result Value Ref Range   Free T4 0.98 0.61 - 1.12 ng/dL    Comment: (NOTE) Biotin ingestion may interfere with free  T4 tests. If the results are inconsistent with the TSH level, previous test results, or the clinical presentation, then consider biotin interference. If needed, order repeat testing after stopping biotin.   T3, Free     Status: None   Collection Time: 05/16/16  6:15 AM  Result Value Ref Range   T3, Free 3.2 2.0 - 4.4 pg/mL    Comment: (NOTE) Performed At: [facility] [address] [place], [place] [id number] Mila Homer MD TU:[id number]   Potassium     Status: Abnormal   Collection Time: 05/16/16  6:15 AM  Result Value Ref Range   Potassium 3.1 (L) 3.5 - 5.1 mmol/L  Magnesium     Status: Abnormal   Collection Time: 05/16/16  6:15 AM  Result Value Ref Range   Magnesium 1.6 (L) 1.7 - 2.4 mg/dL  CBC     Status: Abnormal    Collection Time: 05/17/16  6:45 AM  Result Value Ref Range   WBC 12.1 (H) 3.6 - 11.0 K/uL   RBC 3.49 (L) 3.80 - 5.20 MIL/uL   Hemoglobin 9.7 (L) 12.0 - 16.0 g/dL   HCT 88.6 (L) 38.1 - 51.6 %   MCV 82.1 80.0 - 100.0 fL   MCH 27.9 26.0 - 34.0 pg   MCHC 34.0 32.0 - 36.0 g/dL   RDW 55.7 13.2 - 27.3 %   Platelets 497 (H) 150 - 440 K/uL  Basic metabolic panel     Status: Abnormal   Collection Time: 05/17/16  6:45 AM  Result Value Ref Range   Sodium 137 135 - 145 mmol/L   Potassium 3.4 (L) 3.5 - 5.1 mmol/L   Chloride 109 101 - 111 mmol/L   CO2 22 22 - 32 mmol/L   Glucose, Bld 77 65 - 99 mg/dL   BUN <5 (L) 6 - 20 mg/dL   Creatinine, Ser 8.92 0.44 - 1.00 mg/dL   Calcium 8.2 (L) 8.9 - 10.3 mg/dL   GFR calc non Af Amer >60 >60 mL/min   GFR calc Af Amer >60 >60 mL/min    Comment: (NOTE) The eGFR has been calculated using the CKD EPI equation. This calculation has not been validated in all clinical situations. eGFR's persistently <60 mL/min signify possible Chronic Kidney Disease.    Anion gap 6 5 - 15  Magnesium     Status: None   Collection Time: 05/17/16  6:45 AM  Result Value Ref Range   Magnesium 1.7 1.7 - 2.4 mg/dL  CBC with Differential/Platelet     Status: Abnormal   Collection Time: 05/18/16  6:32 AM  Result Value Ref Range   WBC 9.7 3.6 - 11.0 K/uL   RBC 3.77 (L) 3.80 - 5.20 MIL/uL   Hemoglobin 10.6 (L) 12.0 - 16.0 g/dL   HCT 60.1 (L) 81.5 - 19.0 %   MCV 81.4 80.0 - 100.0 fL   MCH 28.2 26.0 - 34.0 pg   MCHC 34.6 32.0 - 36.0 g/dL   RDW 41.4 (H) 31.5 - 11.2 %   Platelets 562 (H) 150 - 440 K/uL   Neutrophils Relative % 57 %   Neutro Abs 5.5 1.4 - 6.5 K/uL   Lymphocytes Relative 34 %   Lymphs Abs 3.2 1.0 - 3.6 K/uL   Monocytes Relative 7 %   Monocytes Absolute 0.7 0.2 - 0.9 K/uL   Eosinophils Relative 2 %   Eosinophils Absolute 0.2 0 - 0.7 K/uL   Basophils Relative 0 %   Basophils Absolute 0.0 0 -  0.1 K/uL  Basic metabolic panel     Status: Abnormal   Collection  Time: 05/18/16  6:32 AM  Result Value Ref Range   Sodium 136 135 - 145 mmol/L   Potassium 4.3 3.5 - 5.1 mmol/L   Chloride 105 101 - 111 mmol/L   CO2 24 22 - 32 mmol/L   Glucose, Bld 82 65 - 99 mg/dL   BUN <5 (L) 6 - 20 mg/dL   Creatinine, Ser 0.46 0.44 - 1.00 mg/dL   Calcium 8.5 (L) 8.9 - 10.3 mg/dL   GFR calc non Af Amer >60 >60 mL/min   GFR calc Af Amer >60 >60 mL/min    Comment: (NOTE) The eGFR has been calculated using the CKD EPI equation. This calculation has not been validated in all clinical situations. eGFR's persistently <60 mL/min signify possible Chronic Kidney Disease.    Anion gap 7 5 - 15  Magnesium     Status: None   Collection Time: 05/18/16  6:32 AM  Result Value Ref Range   Magnesium 1.9 1.7 - 2.4 mg/dL   US Ob Comp + 14 Wk  Result Date: 05/15/2016 CLINICAL DATA:  Small for dates EXAM: OBSTETRICAL ULTRASOUND >14 WKS FINDINGS: Number of Fetuses: 1 Heart Rate:  130 bpm Movement: Present Presentation: Cephalic Previa: None Placental Location: Anterior and partially fundal. The distance from the lower placental segment to the internal cervical os is 10 cm. Amniotic Fluid (Subjective): Decreased Amniotic Fluid (Objective):  Decreased AFI 6.8 cm cm (5%ile= 7.9 cm, 95%= 24.9 cm for 35 wks) FETAL BIOMETRY BPD:  8.5cm 34w 2d HC:    30.5cm  34w   0d AC:   3.7cm  35w   5d FL:   6.5cm  33w   2d Current Mean GA: 33w 6d              [place] EDC: June 27, 2016 Estimated Fetal Weight:  2497g    21%ile FETAL ANATOMY Lateral Ventricles: Visualized Thalami/CSP: Visualized Posterior Fossa:  Visualized Nuchal Region: Visualized Upper Lip: Visualized Spine: Visualized 4 Chamber Heart on Left: Visualized LVOT: Visualized RVOT: Visualized Stomach on Left: Visualized 3 Vessel Cord: Visualized Cord Insertion site: Visualized Kidneys: Visualized Bladder: Visualized Extremities: Visualized Sex: Female genitalia Technically difficult due to: Fetal position Maternal Findings: Cervix:  3.9 cm and closed  The patient removal rib ports coughing due to pneumonia. When she coughs she either leaks a small amount of urine or is leaking amniotic fluid. IMPRESSION: Single viable IUP with cephalic presentation and anterior partially fundal placenta. No fetal anomalies are observed. The estimated gestational age is 59 weeks 6 days with estimated date of confinement of June 27, 2016. This is approximally 12 days less mature than the day predicted on clinical grounds. The fetal weight is at the twenty-first percentile for [redacted] week gestation. The amniotic fluid volume is low with the amniotic fluid index below the fifth percentile. The patient reports leaking either urine or amniotic fluid when she coughs. She has been suffering from pneumonia. Electronically Signed   By: David  Martinique M.D.   On: 05/15/2016 15:45   Treatments: IV hydration, antibiotics: ceftriaxone and azithromycin, steroids: solu-medrol and prednisone and respiratory therapy: albuterol/atropine nebulizer  Discharge Exam: Blood pressure 114/71, pulse (!) 110, temperature 98 F (36.7 C), temperature source Oral, resp. rate 20, height '5\' 3"'$  (1.6 m), weight 147 lb (66.7 kg), SpO2 99 %. General appearance: alert, appears stated age and no distress Resp: rhonchi RLL and RML Cardio: regular rate  and rhythm, S1, S2 normal, no murmur, click, rub or gallop GI: soft, non-tender; bowel sounds normal; no masses,  no organomegaly and gravid uterus Extremities: extremities normal, atraumatic, no cyanosis or edema  Disposition: 02-Transferred to [facility]  Discharge Instructions    Discharge activity:  No Restrictions    Complete by:  As directed    Discharge diet:  No restrictions    Complete by:  As directed    Fetal Kick Count:  Lie on our left side for one hour after a meal, and count the number of times your baby kicks.  If it is less than 5 times, get up, move around and drink some juice.  Repeat the test 30 minutes later.  If it is still  less than 5 kicks in an hour, notify your doctor.    Complete by:  As directed    LABOR:  When conractions begin, you should start to time them from the beginning of one contraction to the beginning  of the next.  When contractions are 5 - 10 minutes apart or less and have been regular for at least an hour, you should call your health care provider.    Complete by:  As directed    No sexual activity restrictions    Complete by:  As directed    Notify physician for bleeding from the vagina    Complete by:  As directed    Notify physician for blurring of vision or spots before the eyes    Complete by:  As directed    Notify physician for chills or fever    Complete by:  As directed    Notify physician for fainting spells, "black outs" or loss of consciousness    Complete by:  As directed    Notify physician for increase in vaginal discharge    Complete by:  As directed    Notify physician for leaking of fluid    Complete by:  As directed    Notify physician for pain or burning when urinating    Complete by:  As directed    Notify physician for pelvic pressure (sudden increase)    Complete by:  As directed    Notify physician for severe or continued nausea or vomiting    Complete by:  As directed    Notify physician for sudden gushing of fluid from the vagina (with or without continued leaking)    Complete by:  As directed    Notify physician for sudden, constant, or occasional abdominal pain    Complete by:  As directed    Notify physician if baby moving less than usual    Complete by:  As directed      Allergies as of 05/18/2016   No Known Allergies     Medication List    STOP taking these medications   DELSYM 30 MG/5ML liquid Generic drug:  dextromethorphan   guaiFENesin 600 MG 12 hr tablet Commonly known as:  MUCINEX   ibuprofen 200 MG tablet Commonly known as:  ADVIL,MOTRIN     TAKE these medications   acetaminophen 325 MG tablet Commonly known as:  TYLENOL Take  650 mg by mouth every 6 (six) hours as needed for mild pain, fever or headache.   albuterol 108 (90 Base) MCG/ACT inhaler Commonly known as:  PROVENTIL HFA;VENTOLIN HFA Inhale 2 puffs into the lungs every 6 (six) hours as needed for wheezing or shortness of breath.   amoxicillin-clavulanate 875-125 MG tablet Commonly known  as:  AUGMENTIN Take 1 tablet by mouth 2 (two) times daily.   azithromycin 250 MG tablet Commonly known as:  ZITHROMAX Take 1 tablet (250 mg total) by mouth daily.   doxylamine (Sleep) 25 MG tablet Commonly known as:  UNISOM Take 25 mg by mouth at bedtime as needed for sleep.   guaiFENesin-codeine 100-10 MG/5ML syrup Take 10 mLs by mouth every 4 (four) hours as needed for cough.   ondansetron 4 MG disintegrating tablet Commonly known as:  ZOFRAN ODT Take 1 tablet (4 mg total) by mouth every 6 (six) hours as needed for nausea.   predniSONE 10 MG tablet Commonly known as:  DELTASONE Take 4 tablets (40 mg) by mouth once daily for first 2 days, then take 3 tablets (30 mg) by mouth once daily for 2 days, take 2 tablets (20 mg) by mouth once daily for 2 days, then 1 tablet ('10mg'$ ) by mouth once daily for 2 days      Follow-up Information    [facility]. Schedule an appointment as soon as possible for a visit in 1 week(s).   Contact information: [address] [place] [address] [phone]          Signed: Dorthula Nettles 05/18/2016, 12:19 PM

## 2016-05-19 LAB — CULTURE, BLOOD (ROUTINE X 2)
CULTURE: NO GROWTH
Culture: NO GROWTH

## 2016-05-22 LAB — OB RESULTS CONSOLE GC/CHLAMYDIA
Chlamydia: NEGATIVE
Gonorrhea: NEGATIVE

## 2016-05-22 LAB — OB RESULTS CONSOLE GBS: GBS: NEGATIVE

## 2016-05-31 ENCOUNTER — Encounter: Payer: Medicaid Other | Admitting: Obstetrics & Gynecology

## 2016-06-01 ENCOUNTER — Encounter: Payer: Self-pay | Admitting: *Deleted

## 2016-06-01 ENCOUNTER — Observation Stay
Admission: EM | Admit: 2016-06-01 | Discharge: 2016-06-01 | Disposition: A | Discharge status: Home / Self Care | Payer: Medicaid Other | Attending: Obstetrics & Gynecology | Admitting: Obstetrics & Gynecology

## 2016-06-01 DIAGNOSIS — R109 Unspecified abdominal pain: Secondary | ICD-10-CM | POA: Insufficient documentation

## 2016-06-01 DIAGNOSIS — O26893 Other specified pregnancy related conditions, third trimester: Secondary | ICD-10-CM

## 2016-06-01 DIAGNOSIS — Z3A38 38 weeks gestation of pregnancy: Secondary | ICD-10-CM

## 2016-06-01 DIAGNOSIS — R112 Nausea with vomiting, unspecified: Secondary | ICD-10-CM | POA: Insufficient documentation

## 2016-06-01 DIAGNOSIS — Z87891 Personal history of nicotine dependence: Secondary | ICD-10-CM | POA: Diagnosis not present

## 2016-06-01 DIAGNOSIS — O219 Vomiting of pregnancy, unspecified: Secondary | ICD-10-CM | POA: Diagnosis present

## 2016-06-01 MED ORDER — ACETAMINOPHEN 325 MG PO TABS: 650.0000 mg | ORAL_TABLET | ORAL | Status: DC | PRN

## 2016-06-01 MED ORDER — ONDANSETRON HCL 4 MG/2ML IJ SOLN
4.0000 mg | Freq: Four times a day (QID) | INTRAMUSCULAR | Status: DC | PRN
  Administered 2016-06-01: 4 mg via INTRAMUSCULAR

## 2016-06-01 MED ORDER — ONDANSETRON HCL 4 MG/2ML IJ SOLN
INTRAMUSCULAR | Status: AC
  Filled 2016-06-01: qty 2

## 2016-06-01 NOTE — Discharge Instructions (Signed)
Pt has next appt Friday at 0930 with [facility]. Pt will call MD or [facility] with any questions or concerns. Discussed labor precautions with pt and FOB>  C Giah Fickett RNC

## 2016-06-01 NOTE — Plan of Care (Signed)
Discharge instructions, both oral and written, given to pt and SO.  Precautions for labor discussed at length.  Pt agrees with plan of care and is asking to be discharged home at this time.  Pt leaving department in stable condition for home.  Pt has next appt Friday at 0930 with [facility].  Ellison Carwin Ram Haugan RNC

## 2016-06-01 NOTE — H&P (Signed)
Obstetrics Admission History & Physical   CC: Nausea and vomiting in pregnancy, antepartum, and painful abdominal cramps    HPI:  31 y.o. [id number] @ 2546w0d (06/15/2016, by Ultrasound). Admitted on 06/01/2016:   Patient Active Problem List   Diagnosis Date Noted  . Pneumonia affecting pregnancy in third trimester 05/14/2016    Presents for 1 day h/o painful cramps assoc w n/v, some bloody show, no ROM. No recent change in diet, trauma, infection (recovered from pneumonia).   [facility] Pregnancy complicated by none.  ROS: A review of systems was performed and negative, except as stated in the above HPI. PMHx:  Past Medical History:  Diagnosis Date  . GERD (gastroesophageal reflux disease)    PSHx:  Past Surgical History:  Procedure Laterality Date  . HAND SURGERY    . WISDOM TOOTH EXTRACTION     Medications:  Prescriptions Prior to Admission  Medication Sig Dispense Refill Last Dose  . acetaminophen (TYLENOL) 325 MG tablet Take 650 mg by mouth every 6 (six) hours as needed for mild pain, fever or headache.   06/01/2016  . albuterol (PROVENTIL HFA;VENTOLIN HFA) 108 (90 Base) MCG/ACT inhaler Inhale 2 puffs into the lungs every 6 (six) hours as needed for wheezing or shortness of breath. 1 Inhaler 2 06/01/2016  . doxylamine, Sleep, (UNISOM) 25 MG tablet Take 25 mg by mouth at bedtime as needed for sleep.   06/01/2016  . guaiFENesin-codeine 100-10 MG/5ML syrup Take 10 mLs by mouth every 4 (four) hours as needed for cough. 120 mL 0 06/01/2016  . ondansetron (ZOFRAN ODT) 4 MG disintegrating tablet Take 1 tablet (4 mg total) by mouth every 6 (six) hours as needed for nausea. 20 tablet 0 06/01/2016  . predniSONE (DELTASONE) 10 MG tablet Take 4 tablets (40 mg) by mouth once daily for first 2 days, then take 3 tablets (30 mg) by mouth once daily for 2 days, take 2 tablets (20 mg) by mouth once daily for 2 days, then 1 tablet (10mg ) by mouth once daily for 2 days 20 tablet 0 06/01/2016    Allergies: has No Known Allergies. OBHx:  OB History  Gravida Para Term Preterm AB Living  4 2 2   1 2   SAB TAB Ectopic Multiple Live Births    1     2    # Outcome Date GA Lbr Len/2nd Weight Sex Delivery Anes PTL Lv  4 Current           3 Term 11/16/10 412w0d  7 lb 9 oz (3.43 kg) M Vag-Spont   LIV  2 TAB 05/15/09          1 Term 05/17/07 205w0d  7 lb 8 oz (3.402 kg) F Vag-Spont   LIV     AVW:UJWJXBJY/[id number]:Negative/unremarkable except as detailed in HPI.Marland Kitchen.  No family history of birth defects. Soc Hx: Former smoker, Alcohol: none, Recreational drug use: former, Denies domestic abuse and Pregnancy welcomed  Objective:  There were no vitals filed for this visit. Constitutional: Well nourished, well developed female in no acute distress.  HEENT: normal Skin: Warm and dry.  Cardiovascular:Regular rate and rhythm.   Extremity: trace to 1+ bilateral pedal edema Respiratory: Clear to auscultation bilateral. Normal respiratory effort Abdomen: moderate Back: no CVAT Neuro: DTRs 2+, Cranial nerves grossly intact Psych: Alert and Oriented x3. No memory deficits. Normal mood and affect.  MS: normal gait, normal bilateral lower extremity ROM/strength/stability.  Pelvic exam: is not limited by body habitus EGBUS:  within normal limits Vagina: within normal limits and with normal mucosa blood in the vault Cervix: 0.5 cm Uterus: Uterus demonstrates irritability pattern.  Adnexa: not evaluated  EFM:FHR: 140 bpm, variability: moderate,  accelerations:  Present,  decelerations:  Absent Toco: Frequency: Every irreg timespan 5-10 minutes   Perinatal info:  Blood type: O positive Rubella- Immune Varicella -Immune TDaP Given during third trimester of this pregnancy RPR NR / HIV Neg/ HBsAg Neg   Assessment & Plan:   31 y.o. [id number] @ [redacted]w[redacted]d, Admitted on 06/01/2016:Nausea and vomiting antepartum assoc w pain No signs of labor based on cervical exam. Monitor, hydrate, ambulate, treat nausea if recurs     Annamarie Major, MD, [facility], [facility] 06/01/2016  10:59 AM

## 2016-06-01 NOTE — OB Triage Note (Signed)
See chief complaint 

## 2016-06-01 NOTE — Discharge Summary (Signed)
Physician Discharge Summary  Patient ID: Karen ShinerChelsea L Rios MRN: [id number] DOB/AGE: 29/05/1985 28 y.o.  Admit date: 06/01/2016 Discharge date: 06/01/2016  Admission Diagnoses: Nausea, Vomiting, Pain, Third Trimester  Discharge Diagnoses:  Active Problems:   Pregnancy related nausea and vomiting, antepartum   Discharged Condition: good  Hospital Course: 31 yo G4P2012 at 38+ weeks EGA, with presentation for nausea and pain; see H&P.  Monitored with no signs of cervical change or reg ctx pattern, even after ambulation. Decision made to go home.  No further nausea.  Tol po's well.  Consults: None  Significant Diagnostic Studies: A NST procedure was performed with FHR monitoring and a normal baseline established, appropriate time of 20-40 minutes of evaluation, and accels >2 seen w 15x15 characteristics.  Results show a REACTIVE NST.   Treatments: none  Discharge Exam: Blood pressure 125/73, pulse (!) 111, temperature 98.2 F (36.8 C), temperature source Oral, resp. rate 16, height 5\' 3"  (1.6 m), weight 150 lb (68 kg). General appearance: alert, cooperative and no distress Pelvic: Cervix unchanged Extremities: extremities normal, atraumatic, no cyanosis or edema  Disposition: 01-Home or Self Care   Allergies as of 06/01/2016      Reactions   Latex Itching   Also rash, states it does not bother her too much      Medication List    TAKE these medications   acetaminophen 325 MG tablet Commonly known as:  TYLENOL Take 650 mg by mouth every 6 (six) hours as needed for mild pain, fever or headache.   albuterol 108 (90 Base) MCG/ACT inhaler Commonly known as:  PROVENTIL HFA;VENTOLIN HFA Inhale 2 puffs into the lungs every 6 (six) hours as needed for wheezing or shortness of breath.   doxylamine (Sleep) 25 MG tablet Commonly known as:  UNISOM Take 25 mg by mouth at bedtime as needed for sleep.   guaiFENesin-codeine 100-10 MG/5ML syrup Take 10 mLs by mouth every 4 (four) hours as  needed for cough.   ondansetron 4 MG disintegrating tablet Commonly known as:  ZOFRAN ODT Take 1 tablet (4 mg total) by mouth every 6 (six) hours as needed for nausea.   predniSONE 10 MG tablet Commonly known as:  DELTASONE Take 4 tablets (40 mg) by mouth once daily for first 2 days, then take 3 tablets (30 mg) by mouth once daily for 2 days, take 2 tablets (20 mg) by mouth once daily for 2 days, then 1 tablet (10mg ) by mouth once daily for 2 days        Signed: Letitia LibraRobert Paul Spyros Winch 06/01/2016, 5:02 PM

## 2016-06-01 NOTE — Progress Notes (Addendum)
Pt reporting feeling pain at times in fundal area of uterus, and at times low, mid area of abd. Uterus palpating as irritable. Pt's uterus soft to touch when pt reporting upper fundal pain. Reports having diarrhea x1 week, nausea today, with vomitus of breakfast and again after such, total of two events. Pt not following encouragement with breathing- states she is hurting. Reports she would like to [place] game this evening and baby better come before or after such. Pt also reports when asked that no one at home is ill. Pt reports having the flu mid Feb. Developed into pneumonia- states she has stopped smoking cigarettes when she found out she was pregnant, but has on occasion smoked a few cigarettes. Pt states she was hospitalized for 1 week. States she also developed a bilateral ear infection, and that R ear actually ruptured. States her hearing is fine presently

## 2016-06-07 ENCOUNTER — Encounter: Payer: Medicaid Other | Admitting: Certified Nurse Midwife

## 2016-06-11 ENCOUNTER — Encounter: Payer: Medicaid Other | Admitting: Obstetrics & Gynecology

## 2016-06-16 ENCOUNTER — Inpatient Hospital Stay: Payer: Medicaid Other | Admitting: Anesthesiology

## 2016-06-16 ENCOUNTER — Inpatient Hospital Stay
Admission: EM | Admit: 2016-06-16 | Discharge: 2016-06-18 | DRG: 775 | Disposition: A | Discharge status: Home / Self Care | Payer: Medicaid Other | Attending: Obstetrics and Gynecology | Admitting: Obstetrics and Gynecology

## 2016-06-16 DIAGNOSIS — K219 Gastro-esophageal reflux disease without esophagitis: Secondary | ICD-10-CM | POA: Diagnosis present

## 2016-06-16 DIAGNOSIS — Z87891 Personal history of nicotine dependence: Secondary | ICD-10-CM | POA: Diagnosis not present

## 2016-06-16 DIAGNOSIS — Z833 Family history of diabetes mellitus: Secondary | ICD-10-CM | POA: Diagnosis not present

## 2016-06-16 DIAGNOSIS — Z3A39 39 weeks gestation of pregnancy: Secondary | ICD-10-CM | POA: Diagnosis not present

## 2016-06-16 DIAGNOSIS — Z3493 Encounter for supervision of normal pregnancy, unspecified, third trimester: Secondary | ICD-10-CM | POA: Diagnosis present

## 2016-06-16 DIAGNOSIS — Z8249 Family history of ischemic heart disease and other diseases of the circulatory system: Secondary | ICD-10-CM

## 2016-06-16 DIAGNOSIS — O9962 Diseases of the digestive system complicating childbirth: Secondary | ICD-10-CM | POA: Diagnosis present

## 2016-06-16 DIAGNOSIS — O4202 Full-term premature rupture of membranes, onset of labor within 24 hours of rupture: Secondary | ICD-10-CM | POA: Diagnosis present

## 2016-06-16 LAB — CBC
HEMATOCRIT: 35.3 % (ref 35.0–47.0)
Hemoglobin: 11.7 g/dL — ABNORMAL LOW (ref 12.0–16.0)
MCH: 27.5 pg (ref 26.0–34.0)
MCHC: 33.1 g/dL (ref 32.0–36.0)
MCV: 82.9 fL (ref 80.0–100.0)
Platelets: 316 10*3/uL (ref 150–440)
RBC: 4.26 MIL/uL (ref 3.80–5.20)
RDW: 15.5 % — AB (ref 11.5–14.5)
WBC: 13.1 10*3/uL — ABNORMAL HIGH (ref 3.6–11.0)

## 2016-06-16 LAB — TYPE AND SCREEN
ABO/RH(D): O POS
Antibody Screen: NEGATIVE

## 2016-06-16 MED ORDER — ONDANSETRON HCL 4 MG/2ML IJ SOLN: 4.0000 mg | Freq: Three times a day (TID) | INTRAMUSCULAR | Status: DC | PRN

## 2016-06-16 MED ORDER — OXYTOCIN 10 UNIT/ML IJ SOLN: 10.0000 [IU] | Freq: Once | INTRAMUSCULAR | Status: DC

## 2016-06-16 MED ORDER — OXYTOCIN BOLUS FROM INFUSION
500.0000 mL | Freq: Once | INTRAVENOUS | Status: AC
  Administered 2016-06-16: 500 mL via INTRAVENOUS

## 2016-06-16 MED ORDER — KETOROLAC TROMETHAMINE 30 MG/ML IJ SOLN: 30.0000 mg | Freq: Four times a day (QID) | INTRAMUSCULAR | Status: DC | PRN

## 2016-06-16 MED ORDER — SOD CITRATE-CITRIC ACID 500-334 MG/5ML PO SOLN
30.0000 mL | ORAL | Status: DC | PRN
  Filled 2016-06-16: qty 30

## 2016-06-16 MED ORDER — SODIUM CHLORIDE FLUSH 0.9 % IV SOLN
INTRAVENOUS | Status: AC
  Filled 2016-06-16: qty 10

## 2016-06-16 MED ORDER — ONDANSETRON HCL 4 MG/2ML IJ SOLN
4.0000 mg | Freq: Four times a day (QID) | INTRAMUSCULAR | Status: DC | PRN
  Administered 2016-06-16: 4 mg via INTRAVENOUS
  Filled 2016-06-16 (×2): qty 2

## 2016-06-16 MED ORDER — LIDOCAINE HCL (PF) 1 % IJ SOLN: 30.0000 mL | INTRAMUSCULAR | Status: DC | PRN

## 2016-06-16 MED ORDER — OXYTOCIN 40 UNITS IN LACTATED RINGERS INFUSION - SIMPLE MED
1.0000 m[IU]/min | INTRAVENOUS | Status: DC
  Administered 2016-06-16: 1 m[IU]/min via INTRAVENOUS
  Administered 2016-06-16: 18 m[IU]/min via INTRAVENOUS

## 2016-06-16 MED ORDER — NALBUPHINE HCL 10 MG/ML IJ SOLN: 5.0000 mg | INTRAMUSCULAR | Status: DC | PRN

## 2016-06-16 MED ORDER — LIDOCAINE HCL (PF) 1 % IJ SOLN
INTRAMUSCULAR | Status: AC
  Filled 2016-06-16: qty 30

## 2016-06-16 MED ORDER — IBUPROFEN 600 MG PO TABS
ORAL_TABLET | ORAL | Status: AC
  Administered 2016-06-16: 600 mg
  Filled 2016-06-16: qty 1

## 2016-06-16 MED ORDER — MEPERIDINE HCL 25 MG/ML IJ SOLN: 6.2500 mg | INTRAMUSCULAR | Status: DC | PRN

## 2016-06-16 MED ORDER — NALBUPHINE HCL 10 MG/ML IJ SOLN
5.0000 mg | INTRAMUSCULAR | Status: DC | PRN
  Administered 2016-06-16: 5 mg via INTRAVENOUS
  Filled 2016-06-16: qty 1

## 2016-06-16 MED ORDER — LACTATED RINGERS IV SOLN
500.0000 mL | INTRAVENOUS | Status: DC | PRN
  Administered 2016-06-16: 500 mL via INTRAVENOUS

## 2016-06-16 MED ORDER — DIPHENHYDRAMINE HCL 25 MG PO CAPS: 25.0000 mg | ORAL_CAPSULE | ORAL | Status: DC | PRN

## 2016-06-16 MED ORDER — FENTANYL 2.5 MCG/ML W/ROPIVACAINE 0.2% IN NS 100 ML EPIDURAL INFUSION (ARMC-ANES): 10.0000 mL/h | EPIDURAL | Status: DC

## 2016-06-16 MED ORDER — MISOPROSTOL 200 MCG PO TABS
ORAL_TABLET | ORAL | Status: AC
  Filled 2016-06-16: qty 4

## 2016-06-16 MED ORDER — NALBUPHINE HCL 10 MG/ML IJ SOLN: 5.0000 mg | Freq: Once | INTRAMUSCULAR | Status: AC | PRN

## 2016-06-16 MED ORDER — FENTANYL 2.5 MCG/ML W/ROPIVACAINE 0.2% IN NS 100 ML EPIDURAL INFUSION (ARMC-ANES)
EPIDURAL | Status: AC
  Filled 2016-06-16: qty 100

## 2016-06-16 MED ORDER — AMMONIA AROMATIC IN INHA
RESPIRATORY_TRACT | Status: AC
  Filled 2016-06-16: qty 10

## 2016-06-16 MED ORDER — ROPIVACAINE HCL 2 MG/ML IJ SOLN
10.0000 mL/h | INTRAMUSCULAR | Status: DC
  Administered 2016-06-16: 10 mL/h via EPIDURAL
  Filled 2016-06-16 (×3): qty 5

## 2016-06-16 MED ORDER — LIDOCAINE HCL (PF) 1 % IJ SOLN
INTRAMUSCULAR | Status: DC | PRN
Start: 2016-06-16 — End: 2016-06-16
  Administered 2016-06-16: 5 mL via SUBCUTANEOUS

## 2016-06-16 MED ORDER — HYDROMORPHONE HCL 1 MG/ML IJ SOLN
0.5000 mg | INTRAMUSCULAR | Status: AC
  Administered 2016-06-16: 0.5 mg via INTRAVENOUS
  Filled 2016-06-16: qty 0.5

## 2016-06-16 MED ORDER — SODIUM CHLORIDE 0.9 % IV SOLN
INTRAVENOUS | Status: DC | PRN
  Administered 2016-06-16: 5 mL via EPIDURAL

## 2016-06-16 MED ORDER — FENTANYL 2.5 MCG/ML W/ROPIVACAINE 0.2% IN NS 100 ML EPIDURAL INFUSION (ARMC-ANES)
EPIDURAL | Status: DC | PRN
  Administered 2016-06-16: 10 mL/h via EPIDURAL

## 2016-06-16 MED ORDER — OXYTOCIN 10 UNIT/ML IJ SOLN
INTRAMUSCULAR | Status: DC
Start: 2016-06-16 — End: 2016-06-16
  Filled 2016-06-16: qty 2

## 2016-06-16 MED ORDER — NALOXONE HCL 0.4 MG/ML IJ SOLN
0.4000 mg | INTRAMUSCULAR | Status: DC | PRN
Start: 2016-06-16 — End: 2016-06-17

## 2016-06-16 MED ORDER — TERBUTALINE SULFATE 1 MG/ML IJ SOLN: 0.2500 mg | Freq: Once | INTRAMUSCULAR | Status: DC | PRN

## 2016-06-16 MED ORDER — LIDOCAINE-EPINEPHRINE (PF) 1.5 %-1:200000 IJ SOLN
INTRAMUSCULAR | Status: DC | PRN
  Administered 2016-06-16: 3 mL via EPIDURAL

## 2016-06-16 MED ORDER — NALBUPHINE HCL 10 MG/ML IJ SOLN
5.0000 mg | Freq: Once | INTRAMUSCULAR | Status: AC | PRN
  Administered 2016-06-16: 5 mg via INTRAVENOUS
  Filled 2016-06-16: qty 1

## 2016-06-16 MED ORDER — NALOXONE HCL 2 MG/2ML IJ SOSY
1.0000 ug/kg/h | PREFILLED_SYRINGE | INTRAVENOUS | Status: DC | PRN
  Filled 2016-06-16: qty 2

## 2016-06-16 MED ORDER — DIPHENHYDRAMINE HCL 50 MG/ML IJ SOLN: 12.5000 mg | INTRAMUSCULAR | Status: DC | PRN

## 2016-06-16 MED ORDER — EPHEDRINE SULFATE 50 MG/ML IJ SOLN
INTRAMUSCULAR | Status: AC
  Administered 2016-06-16: 10 mg
  Filled 2016-06-16: qty 1

## 2016-06-16 MED ORDER — LACTATED RINGERS IV SOLN
INTRAVENOUS | Status: DC
  Administered 2016-06-16 (×3): via INTRAVENOUS

## 2016-06-16 MED ORDER — OXYTOCIN 40 UNITS IN LACTATED RINGERS INFUSION - SIMPLE MED
INTRAVENOUS | Status: AC
  Filled 2016-06-16: qty 1000

## 2016-06-16 MED ORDER — SODIUM CHLORIDE 0.9% FLUSH: 3.0000 mL | INTRAVENOUS | Status: DC | PRN

## 2016-06-16 MED ORDER — OXYTOCIN 40 UNITS IN LACTATED RINGERS INFUSION - SIMPLE MED: 2.5000 [IU]/h | INTRAVENOUS | Status: DC

## 2016-06-16 NOTE — Anesthesia Procedure Notes (Signed)
Epidural Patient location during procedure: OB Start time: 06/16/2016 6:03 AM End time: 06/16/2016 6:25 AM  Staffing Anesthesiologist: Lenard SimmerKARENZ, Vidur Knust Performed: anesthesiologist   Preanesthetic Checklist Completed: patient identified, site marked, surgical consent, pre-op evaluation, timeout performed, IV checked, risks and benefits discussed and monitors and equipment checked  Epidural Patient position: sitting Prep: ChloraPrep Patient monitoring: heart rate, continuous pulse ox and blood pressure Approach: midline Location: L3-L4 Injection technique: LOR saline  Needle:  Needle type: Tuohy  Needle gauge: 17 G Needle length: 9 cm and 9 Needle insertion depth: 5.5 cm Catheter type: closed end flexible Catheter size: 19 Gauge Catheter at skin depth: 10 cm Test dose: negative and 1.5% lidocaine with Epi 1:200 K  Assessment Sensory level: T10 Events: blood not aspirated, injection not painful, no injection resistance, negative IV test and no paresthesia  Additional Notes Pt. Evaluated and documentation done after procedure finished. Patient identified. Risks/Benefits/Options discussed with patient including but not limited to bleeding, infection, nerve damage, paralysis, failed block, incomplete pain control, headache, blood pressure changes, nausea, vomiting, reactions to medication both or allergic, itching and postpartum back pain. Confirmed with bedside nurse the patient's most recent platelet count. Confirmed with patient that they are not currently taking any anticoagulation, have any bleeding history or any family history of bleeding disorders. Patient expressed understanding and wished to proceed. All questions were answered. Sterile technique was used throughout the entire procedure. Please see nursing notes for vital signs. Test dose was given through epidural catheter and negative prior to continuing to dose epidural or start infusion. Warning signs of high block given to  the patient including shortness of breath, tingling/numbness in hands, complete motor block, or any concerning symptoms with instructions to call for help. Patient was given instructions on fall risk and not to get out of bed. All questions and concerns addressed with instructions to call with any issues or inadequate analgesia.   Patient tolerated the insertion well without immediate complications.Reason for block:procedure for pain

## 2016-06-16 NOTE — Anesthesia Preprocedure Evaluation (Addendum)
Anesthesia Evaluation  Patient identified by MRN, date of birth, ID band Patient awake    Reviewed: Allergy & Precautions, H&P , NPO status , Patient's Chart, lab work & pertinent test results, reviewed documented beta blocker date and time   History of Anesthesia Complications Negative for: history of anesthetic complications  Airway Mallampati: I  TM Distance: >3 FB Neck ROM: full    Dental  (+) Teeth Intact   Pulmonary neg shortness of breath, neg sleep apnea, pneumonia, resolved, neg recent URI, former smoker,           Cardiovascular Exercise Tolerance: Good negative cardio ROS       Neuro/Psych negative neurological ROS  negative psych ROS   GI/Hepatic Neg liver ROS, GERD  ,  Endo/Other  negative endocrine ROS  Renal/GU negative Renal ROS  negative genitourinary   Musculoskeletal   Abdominal   Peds  Hematology negative hematology ROS (+)   Anesthesia Other Findings Past Medical History: No date: GERD (gastroesophageal reflux disease)     Comment: with pregnancy   Reproductive/Obstetrics (+) Pregnancy                            Anesthesia Physical Anesthesia Plan  ASA: II  Anesthesia Plan: Epidural   Post-op Pain Management:    Induction:   Airway Management Planned:   Additional Equipment:   Intra-op Plan:   Post-operative Plan:   Informed Consent: I have reviewed the patients History and Physical, chart, labs and discussed the procedure including the risks, benefits and alternatives for the proposed anesthesia with the patient or authorized representative who has indicated his/her understanding and acceptance.   Dental Advisory Given  Plan Discussed with: Anesthesiologist, CRNA and Surgeon  Anesthesia Plan Comments:         Anesthesia Quick Evaluation

## 2016-06-16 NOTE — Progress Notes (Signed)
Labor Check  Subj:  Complaints: comfortable with epidural   Obj:  BP 101/65   Pulse 91   Temp 97.6 F (36.4 C) (Axillary)   Resp 14   Ht 5\' 3"  (1.6 m)   Wt 154 lb (69.9 kg)   LMP 09/14/2015   SpO2 100%   BMI 27.28 kg/m  Dose (milli-units/min) Oxytocin: 4 milli-units/min  Cervix: Dilation: 4.5 / Effacement (%): 60 / Station: Ballotable  Baseline FHR: 125 bpm    Variability: moderate    Accelerations: present    Decelerations: absent Contractions: present frequency: 2 q 10 min  A/P: 31 y.o. [id number] female at 6730w3d with active labor, now requiring augmentation.  1.  Labor: continue pitocin per protocol.  Will AROM when safe.   2.  FWB: reassuring, Overall assessment: category 1  3.  GBS neg  4.  Pain: epidural 5.  Recheck: prn   Thomasene MohairStephen Chellsie Gomer, MD 06/16/2016 11:54 AM

## 2016-06-16 NOTE — Progress Notes (Signed)
Patient ID: Karen ShinerChelsea L Duarte, female   DOB: 04/13/1985, 31 y.o.   MRN: [id number]  Labor Check  Subj:  Complaints: comfortable with epidural   Obj:  BP 101/64   Pulse 87   Temp 98.2 F (36.8 C) (Oral)   Resp 14   Ht 5\' 3"  (1.6 m)   Wt 154 lb (69.9 kg)   LMP 09/14/2015   SpO2 100%   BMI 27.28 kg/m  Dose (milli-units/min) Oxytocin: 20 milli-units/min  Cervix: Dilation: 5 / Effacement (%): 80 / Station: -2   SROM just prior to cervical exam - clear Baseline FHR: 125 bpm    Variability: moderate    Accelerations: present    Decelerations: absent Contractions: present frequency: 5 q 10 min  A/P: 31 y.o. [id number] female at 1430w3d with active labor, now augmented with witnessed SROM, clear fluid.  1.  Labor: continue pitocin. Recheck in 1 hour. If no change, will place IUPC  2.  FWB: reassuring, Overall assessment: category 1  3.  GBS neg  4.  Pain: epidural 5.  Recheck: 1 hour or prn   Thomasene MohairStephen Jackson, MD 06/16/2016 7:53 PM

## 2016-06-16 NOTE — H&P (Signed)
OB History & Physical   History of Present Illness:  Chief Complaint: contractions  HPI:  Karen Duarte is a 31 y.o. [phone] female at [redacted]w[redacted]d dated by LMP consistent with a 10 week ultrasound.  Her pregnancy has been complicated by admission last month for pneumonia, bronichitis, otitis media.    She reports contractions.   She denies leakage of fluid.   She denies vaginal bleeding.   She reports fetal movement.    Maternal Medical History:   Past Medical History:  Diagnosis Date  . GERD (gastroesophageal reflux disease)    with pregnancy    Past Surgical History:  Procedure Laterality Date  . DILATION AND CURETTAGE OF UTERUS     Therapuetic abortion  . HAND SURGERY    . WISDOM TOOTH EXTRACTION      Allergies  Allergen Reactions  . Latex Itching    Also rash, states it does not bother her too much    Prior to Admission medications   Medication Sig Start Date End Date Taking? Authorizing Provider  acetaminophen (TYLENOL) 325 MG tablet Take 650 mg by mouth every 6 (six) hours as needed for mild pain, fever or headache.   Yes Historical Provider, MD  albuterol (PROVENTIL HFA;VENTOLIN HFA) 108 (90 Base) MCG/ACT inhaler Inhale 2 puffs into the lungs every 6 (six) hours as needed for wheezing or shortness of breath. 05/18/16  Yes Vena Austria, MD  doxylamine, Sleep, (UNISOM) 25 MG tablet Take 25 mg by mouth at bedtime as needed for sleep.    Historical Provider, MD    OB History  Gravida Para Term Preterm AB Living  4 2 2   1 2   SAB TAB Ectopic Multiple Live Births    1     2    # Outcome Date GA Lbr Len/2nd Weight Sex Delivery Anes PTL Lv  4 Current           3 Term 11/16/10 [redacted]w[redacted]d  7 lb 9 oz (3.43 kg) M Vag-Spont   LIV  2 TAB 05/15/09          1 Term 05/17/07 [redacted]w[redacted]d  7 lb 8 oz (3.402 kg) F Vag-Spont   LIV      Prenatal care site: [facility]  Social History: She  reports that she quit smoking about 6 weeks ago. Her smoking use included Cigarettes. She  smoked 0.00 packs per day for 12.00 years. She quit smokeless tobacco use about 21 months ago. She reports that she does not drink alcohol or use drugs.  Family History: family history includes Diabetes Mellitus I in her father; Diabetes Mellitus II in her paternal grandmother; Hypertension in her paternal grandmother.   Review of Systems: Negative x 10 systems reviewed except as noted in the HPI.    Physical Exam:  Vital Signs: BP 131/77 (BP Location: Left Arm)   Pulse (!) 110   Temp 98 F (36.7 C) (Oral)   Resp 18   Ht 5\' 3"  (1.6 m)   Wt 154 lb (69.9 kg)   LMP 09/14/2015   BMI 27.28 kg/m  Constitutional: Well nourished, well developed female in no acute distress.  HEENT: normal Skin: Warm and dry.  Cardiovascular: Regular rate and rhythm.   Extremity: edema  Respiratory: Clear to auscultation bilateral. Normal respiratory effort Abdomen: gravid, nontender Back: no CVAT Neuro: DTRs 2+, Cranial nerves grossly intact Psych: Alert and Oriented x3. No memory deficits. Normal mood and affect.  MS: normal gait, normal bilateral lower extremity ROM/strength/stability.  Pelvic exam: per RN Cervix: 4cm   Pertinent Results:  Prenatal Labs: Blood type/Rh O positive  Antibody screen negative  Rubella Immune  Varicella Immune    RPR NR  HBsAg negative  HIV negative  GC negative  Chlamydia negative  Genetic screening declined  1 hour GTT 110  3 hour GTT n/a  GBS negative on 05/22/16   Baseline FHR: 120 beats/min   Variability: moderate   Accelerations: present   Decelerations: absent Contractions: present frequency: 3 q 10 min Overall assessment: cat 1  Assessment:  Karen Duarte is a 31 y.o. 834P2012 female at 2263w3d with active labor.   Plan:  1. Admit to Labor & Delivery  2. CBC, T&S, Clrs, IVF 3. GBS negative.   4. Fetwal well-being: reassuring 5. Expectant management   Thomasene MohairStephen Hicks Feick, MD 06/16/2016 3:48 AM

## 2016-06-16 NOTE — Discharge Summary (Signed)
OB Discharge Summary     Patient Name: Karen Duarte DOB: July 10, 1985 MRN: [id number]  Date of admission: 06/16/2016 Delivering MD: Thomasene Mohair, MD  Date of Delivery: 06/16/2016  Date of discharge: 06/18/2016  Admitting diagnosis: 40 weeks contractions Intrauterine pregnancy: [redacted]w[redacted]d     Secondary diagnosis: None     Discharge diagnosis: Term Pregnancy Delivered                                                                                                Post partum procedures:none  Augmentation: Pitocin  Complications: None  Hospital course:  Onset of Labor With Vaginal Delivery     31 y.o. yo [id number] at [redacted]w[redacted]d was admitted in Active Labor on 06/16/2016. Patient had an uncomplicated labor course as follows:  Membrane Rupture Time/Date: 7:50 PM ,06/16/2016   Intrapartum Procedures: Episiotomy: None [1]                                         Lacerations:  None [1]  Patient had a delivery of a Viable infant. 06/16/2016  Information for the patient's newborn:  Alin, Hutchins [[id number]]  Delivery Method: Vag-Spont    Pateint had an uncomplicated postpartum course.  She is ambulating, tolerating a regular diet, passing flatus, and urinating well. Patient is discharged home in stable condition on 06/18/16.   Physical exam  Vitals:   06/17/16 0803 06/17/16 1905 06/18/16 0757 06/18/16 1129  BP: 106/62 109/70 113/68 106/67  Pulse: (!) 107 89 87 90  Resp: 16 18 18 18   Temp: 97.9 F (36.6 C) 98.1 F (36.7 C) 97.9 F (36.6 C) 97.8 F (36.6 C)  TempSrc:  Oral Oral Oral  SpO2: 100%  100%   Weight:      Height:       General: alert, cooperative and no distress Lochia: appropriate Uterine Fundus: firm Incision: N/A DVT Evaluation: No evidence of DVT seen on physical exam. No cords or calf tenderness. No significant calf/ankle edema.  Labs: Lab Results  Component Value Date   WBC 15.1 (H) 06/17/2016   HGB 11.0 (L) 06/17/2016   HCT 33.2 (L) 06/17/2016   MCV  82.6 06/17/2016   PLT 315 06/17/2016    Discharge instruction: per After Visit Summary.  Medications:  Allergies as of 06/18/2016      Reactions   Latex Itching   Also rash, states it does not bother her too much      Medication List    STOP taking these medications   doxylamine (Sleep) 25 MG tablet Commonly known as:  UNISOM     TAKE these medications   acetaminophen 325 MG tablet Commonly known as:  TYLENOL Take 650 mg by mouth every 6 (six) hours as needed for mild pain, fever or headache.   albuterol 108 (90 Base) MCG/ACT inhaler Commonly known as:  PROVENTIL HFA;VENTOLIN HFA Inhale 2 puffs into the lungs every 6 (six) hours as needed for wheezing or shortness of breath.   HYDROcodone-acetaminophen 5-325 MG tablet  Commonly known as:  NORCO/VICODIN Take 1 tablet by mouth every 6 (six) hours as needed (breakthrough pain).   ibuprofen 600 MG tablet Commonly known as:  ADVIL,MOTRIN Take 1 tablet (600 mg total) by mouth every 6 (six) hours.       Diet: routine diet  Activity: Advance as tolerated. Pelvic rest for 6 weeks.   Outpatient follow up: Follow-up Information    Thomasene MohairStephen Abdurahman Rugg, MD Follow up in 6 week(s).   Specialty:  Obstetrics and Gynecology Why:  schedule IUD placement with 6-week postpartum f/u Contact information: [address] [place] [phone]             Postpartum contraception: IUD Mirena Rhogam Given postpartum: no Rubella vaccine given postpartum: no Varicella vaccine given postpartum: no TDaP given antepartum or postpartum: AP  Newborn Data: Live born female ("Layla") Birth Weight: 7 lb 0.2 oz (3180 g) APGAR: 6, 9   Baby Feeding: Breast  Disposition:home with mother  SIGNED: Thomasene MohairStephen Kniyah Khun, MD 06/18/2016 11:46 AM

## 2016-06-17 LAB — CBC
HEMATOCRIT: 33.2 % — AB (ref 35.0–47.0)
HEMOGLOBIN: 11 g/dL — AB (ref 12.0–16.0)
MCH: 27.3 pg (ref 26.0–34.0)
MCHC: 33.1 g/dL (ref 32.0–36.0)
MCV: 82.6 fL (ref 80.0–100.0)
Platelets: 315 10*3/uL (ref 150–440)
RBC: 4.02 MIL/uL (ref 3.80–5.20)
RDW: 15.8 % — ABNORMAL HIGH (ref 11.5–14.5)
WBC: 15.1 10*3/uL — AB (ref 3.6–11.0)

## 2016-06-17 LAB — RPR: RPR Ser Ql: NONREACTIVE

## 2016-06-17 MED ORDER — ACETAMINOPHEN 325 MG PO TABS
650.0000 mg | ORAL_TABLET | ORAL | Status: DC | PRN
  Administered 2016-06-17 – 2016-06-18 (×3): 650 mg via ORAL
  Filled 2016-06-17 (×3): qty 2

## 2016-06-17 MED ORDER — BENZOCAINE-MENTHOL 20-0.5 % EX AERO
1.0000 | INHALATION_SPRAY | CUTANEOUS | Status: DC | PRN
Start: 2016-06-17 — End: 2016-06-18

## 2016-06-17 MED ORDER — COCONUT OIL OIL
1.0000 "application " | TOPICAL_OIL | Status: DC | PRN
  Administered 2016-06-17: 1 via TOPICAL
  Filled 2016-06-17: qty 120

## 2016-06-17 MED ORDER — IBUPROFEN 600 MG PO TABS
600.0000 mg | ORAL_TABLET | Freq: Four times a day (QID) | ORAL | Status: DC
  Administered 2016-06-17 – 2016-06-18 (×6): 600 mg via ORAL
  Filled 2016-06-17 (×6): qty 1

## 2016-06-17 MED ORDER — ONDANSETRON HCL 4 MG PO TABS: 4.0000 mg | ORAL_TABLET | ORAL | Status: DC | PRN

## 2016-06-17 MED ORDER — HYDROCODONE-ACETAMINOPHEN 5-325 MG PO TABS
ORAL_TABLET | ORAL | Status: AC
  Administered 2016-06-17: 1
  Filled 2016-06-17: qty 1

## 2016-06-17 MED ORDER — DIPHENHYDRAMINE HCL 25 MG PO CAPS: 25.0000 mg | ORAL_CAPSULE | Freq: Four times a day (QID) | ORAL | Status: DC | PRN

## 2016-06-17 MED ORDER — PRENATAL MULTIVITAMIN CH
1.0000 | ORAL_TABLET | Freq: Every day | ORAL | Status: DC
  Filled 2016-06-17: qty 1

## 2016-06-17 MED ORDER — DIBUCAINE 1 % RE OINT: 1.0000 "application " | TOPICAL_OINTMENT | RECTAL | Status: DC | PRN

## 2016-06-17 MED ORDER — FERROUS SULFATE 325 (65 FE) MG PO TABS
325.0000 mg | ORAL_TABLET | Freq: Two times a day (BID) | ORAL | Status: DC
  Administered 2016-06-17 – 2016-06-18 (×3): 325 mg via ORAL
  Filled 2016-06-17 (×3): qty 1

## 2016-06-17 MED ORDER — ONDANSETRON HCL 4 MG/2ML IJ SOLN: 4.0000 mg | INTRAMUSCULAR | Status: DC | PRN

## 2016-06-17 MED ORDER — WITCH HAZEL-GLYCERIN EX PADS: 1.0000 "application " | MEDICATED_PAD | CUTANEOUS | Status: DC | PRN

## 2016-06-17 MED ORDER — SENNOSIDES-DOCUSATE SODIUM 8.6-50 MG PO TABS
2.0000 | ORAL_TABLET | ORAL | Status: DC
  Administered 2016-06-17: 2 via ORAL
  Filled 2016-06-17: qty 2

## 2016-06-17 MED ORDER — IBUPROFEN 600 MG PO TABS: 600.0000 mg | ORAL_TABLET | Freq: Four times a day (QID) | ORAL | Status: DC

## 2016-06-17 MED ORDER — HYDROCODONE-ACETAMINOPHEN 5-325 MG PO TABS
1.0000 | ORAL_TABLET | Freq: Four times a day (QID) | ORAL | Status: DC | PRN
  Administered 2016-06-17 – 2016-06-18 (×5): 1 via ORAL
  Filled 2016-06-17 (×6): qty 1

## 2016-06-17 MED ORDER — SIMETHICONE 80 MG PO CHEW: 80.0000 mg | CHEWABLE_TABLET | ORAL | Status: DC | PRN

## 2016-06-17 NOTE — Progress Notes (Signed)
Post Partum Day 1 Subjective: no complaints, up ad lib, voiding, tolerating PO and breastfeeding  Objective: Blood pressure 106/62, pulse (!) 107, temperature 97.9 F (36.6 C), resp. rate 16, height 5\' 3"  (1.6 m), weight 69.9 kg (154 lb), last menstrual period 09/14/2015, SpO2 100 %, unknown if currently breastfeeding.  Physical Exam:  General: alert, cooperative and no distress Lochia: appropriate Uterine Fundus: firm at U-1/ML/NT Incision: NA DVT Evaluation: No evidence of DVT seen on physical exam. No cords or calf tenderness. No significant calf/ankle edema.   Recent Labs  06/16/16 0341 06/17/16 0448  HGB 11.7* 11.0*  HCT 35.3 33.2*  WBC 13.1* 15.1*  PLT 316 315    Assessment/Plan: Stable ppd #1 Plan for discharge tomorrow O POS/ RI/ VI TDAP UTD Breast/ IUD    LOS: 1 day   Karen ConnersColleen Eiza Canniff 06/17/2016, 10:37 AM

## 2016-06-17 NOTE — Anesthesia Postprocedure Evaluation (Signed)
Anesthesia Post Note  Patient: Karen Duarte  Procedure(s) Performed: * No procedures listed *  Patient location during evaluation: Mother Baby Anesthesia Type: Epidural Level of consciousness: awake and alert Pain management: pain level controlled Vital Signs Assessment: post-procedure vital signs reviewed and stable Respiratory status: spontaneous breathing, nonlabored ventilation and respiratory function stable Cardiovascular status: stable Postop Assessment: no headache and epidural receding Anesthetic complications: no     Last Vitals:  Vitals:   06/17/16 0026 06/17/16 0310  BP: 126/73 108/75  Pulse: (!) 106 99  Resp: 20 18  Temp: 36.4 C 37 C    Last Pain:  Vitals:   06/17/16 0650  TempSrc:   PainSc: 5                  [name],  Desirey Keahey P

## 2016-06-18 ENCOUNTER — Encounter: Payer: Medicaid Other | Admitting: Obstetrics and Gynecology

## 2016-06-18 MED ORDER — IBUPROFEN 600 MG PO TABS: 600.0000 mg | ORAL_TABLET | Freq: Four times a day (QID) | ORAL | 0 refills | Status: DC

## 2016-06-18 MED ORDER — HYDROCODONE-ACETAMINOPHEN 5-325 MG PO TABS: 1.0000 | ORAL_TABLET | Freq: Four times a day (QID) | ORAL | 0 refills | Status: DC | PRN

## 2016-06-18 NOTE — Progress Notes (Signed)
D/C order from MD.  Reviewed d/c instructions and prescriptions with patient and answered any questions.  Patient d/c home with infant via wheelchair by nursing/auxillary. 

## 2016-06-18 NOTE — Lactation Note (Signed)
This note was copied from a baby's chart. Lactation Consultation Note  Patient Name: Karen Liam RogersChelsea Aggarwal WUJWJ'XToday's Date: 06/18/2016  (delayed note from yesterday that was not saved then)   [facility] Mom stated to me that baby had been breastfeeding well, but she wants to formula feed bottle too as she did with her other babies. She had many visitors in room and had just fed baby, so I left my name and contact #, and encouraged her to let me know if/when she wants me to return for teaching and/or BF assessment. She has BF booklet and info on BF support group.    Maternal Data    Feeding Feeding Type: Breast Fed Length of feed: 15 min  LATCH Score/Interventions                      Lactation Tools Discussed/Used     Consult Status      Sunday CornSandra Clark Wylee Ogden 06/18/2016, 9:54 AM

## 2016-06-18 NOTE — Discharge Instructions (Signed)
Please call your doctor or return to the ER if you experience any chest pains, shortness of breath, fever greater than 101, any heavy bleeding or large clots, and foul smelling vaginal discharge, any worsening abdominal pain & cramping that is not controlled by pain medication, or any signs of post partum depression.  No tampons, enemas, douches, or sexual intercourse for 6 weeks.  Also avoid tub baths, hot tubs, or swimming for 6 weeks.    

## 2016-08-13 ENCOUNTER — Encounter: Payer: Medicaid Other | Admitting: Obstetrics and Gynecology

## 2016-08-15 ENCOUNTER — Ambulatory Visit (INDEPENDENT_AMBULATORY_CARE_PROVIDER_SITE_OTHER): Payer: Medicaid Other | Admitting: Obstetrics and Gynecology

## 2016-08-15 NOTE — Progress Notes (Signed)
Postpartum Visit   Chief Complaint  Patient presents with  . 6 week Post Partum    History of Present Illness: Patient is a 31 y.o. [id number] presents for postpartum visit.  Date of delivery: 06/16/2016 Type of delivery: Vaginal delivery - Vacuum or forceps assisted  no Episiotomy No.  Laceration: no  Pregnancy or labor problems:  no Any problems since the delivery:  no  Newborn Details:  SINGLETON :  1. Baby's name: 7lb  Maternal Details:  Breast Feeding:  no Post partum depression/anxiety noted:  no Edinburgh Post-Partum Depression Score:  5  Date of last PAP:  11/07/2015, NILM   Review of Systems: ROS  Past Medical History:  Diagnosis Date  . GERD (gastroesophageal reflux disease)    with pregnancy    Past Surgical History:  Procedure Laterality Date  . DILATION AND CURETTAGE OF UTERUS     Therapuetic abortion  . HAND SURGERY    . WISDOM TOOTH EXTRACTION      Family History  Problem Relation Age of Onset  . Diabetes Mellitus I Father   . Diabetes Mellitus II Paternal Grandmother   . Hypertension Paternal Grandmother     Social History   Social History  . Marital status: Single    Spouse name: N/A  . Number of children: N/A  . Years of education: N/A   Occupational History  . Not on file.   Social History Main Topics  . Smoking status: Former Smoker    Packs/day: 0.00    Years: 12.00    Types: Cigarettes    Quit date: 05/04/2016  . Smokeless tobacco: Former NeurosurgeonUser    Quit date: 09/02/2014     Comment: last used 1 week ago  . Alcohol use No  . Drug use: No  . Sexual activity: Yes    Birth control/ protection: None   Other Topics Concern  . Not on file   Social History Narrative  . No narrative on file    Allergies  Allergen Reactions  . Latex Itching    Also rash, states it does not bother her too much    Medications:   Medication Sig Start Date End Date Taking? Authorizing Provider  acetaminophen (TYLENOL) 325 MG tablet Take 650  mg by mouth every 6 (six) hours as needed for mild pain, fever or headache.    [provider]  albuterol (PROVENTIL HFA;VENTOLIN HFA) 108 (90 Base) MCG/ACT inhaler Inhale 2 puffs into the lungs every 6 (six) hours as needed for wheezing or shortness of breath. 05/18/16   Vena AustriaStaebler, Andreas, MD  HYDROcodone-acetaminophen (NORCO/VICODIN) 5-325 MG tablet Take 1 tablet by mouth every 6 (six) hours as needed (breakthrough pain). 06/18/16   Conard NovakJackson, Noel Rodier D, MD  ibuprofen (ADVIL,MOTRIN) 600 MG tablet Take 1 tablet (600 mg total) by mouth every 6 (six) hours. 06/18/16   Conard NovakJackson, Noell Lorensen D, MD    Physical Exam BP 114/76   Ht 5\' 3"  (1.6 m)   Wt 150 lb (68 kg)   BMI 26.57 kg/m   Physical Exam  Constitutional: She is oriented to person, place, and time. She appears well-developed and well-nourished. No distress.  HENT:  Head: Normocephalic and atraumatic.  Eyes: EOM are normal. No scleral icterus.  Neck: Normal range of motion. Neck supple. No thyromegaly present.  Cardiovascular: Normal rate and regular rhythm.  Exam reveals no gallop and no friction rub.   No murmur heard. Pulmonary/Chest: Effort normal and breath sounds normal. No respiratory distress. She has no  wheezes. She has no rales.  Abdominal: Soft. Bowel sounds are normal. She exhibits no distension and no mass. There is no tenderness. There is no rebound and no guarding.  Musculoskeletal: Normal range of motion. She exhibits no edema or tenderness.  Lymphadenopathy:    She has no cervical adenopathy.  Neurological: She is alert and oriented to person, place, and time. No cranial nerve deficit.  Skin: Skin is warm and dry. No rash noted. No erythema.  Psychiatric: She has a normal mood and affect. Her behavior is normal. Judgment normal.     Assessment: 31 y.o. [id number] presenting for 6 week postpartum visit  Plan:   1) Contraception Education given: patient plans mirena IUD, but unable to place today due to the fact that  she has had unprotected intercourse since her last menses.  Will have her follow up in 2 weeks for placement.  2)  Pap - ASCCP guidelines and rational discussed.  Patient not due.  3) Patient underwent screening for postpartum depression with no concerns noted.  4) Follow up 2 weeks for mirena placement  Thomasene Mohair, MD 08/15/2016 2:23 PM

## 2016-09-03 ENCOUNTER — Ambulatory Visit (INDEPENDENT_AMBULATORY_CARE_PROVIDER_SITE_OTHER): Payer: Medicaid Other | Admitting: Obstetrics and Gynecology

## 2016-09-03 ENCOUNTER — Encounter: Payer: Self-pay | Admitting: Obstetrics and Gynecology

## 2016-09-03 VITALS — BP 110/70 | HR 84 | Ht 63.0 in | Wt 150.0 lb

## 2016-09-03 DIAGNOSIS — Z30430 Encounter for insertion of intrauterine contraceptive device: Secondary | ICD-10-CM | POA: Diagnosis not present

## 2016-09-03 NOTE — Progress Notes (Signed)
IUD Insertion Procedure Note Patient identified, informed consent performed, consent signed.   Discussed risks of irregular bleeding, cramping, infection, malpositioning, expulsion or uterine perforation of the IUD (1:1000 placements)  which may require further procedure such as laparoscopy.  IUD while effective at preventing pregnancy do not prevent transmission of sexually transmitted diseases and use of barrier methods for this purpose was discussed. Time out was performed.  Urine pregnancy test negative.  Speculum placed in the vagina.  Cervix visualized.  Cleaned with Betadine x 2.  Grasped anteriorly with a single tooth tenaculum.  Uterus sounded to 9 cm. IUD placed per manufacturer's recommendations.  Strings trimmed to 3 cm. Tenaculum was removed, good hemostasis noted.  Patient tolerated procedure well.   Patient was given post-procedure instructions.  She was advised to have backup contraception for one week.  Patient was also asked to check IUD strings periodically and follow up in 6 weeks for IUD check.  Thomasene MohairStephen Kacy Conely, MD 09/03/2016 3:43 PM

## 2016-09-05 MED ORDER — LEVONORGESTREL 20 MCG/24HR IU IUD: 1.0000 | INTRAUTERINE_SYSTEM | Freq: Once | INTRAUTERINE | 0 refills | Status: DC

## 2016-10-01 ENCOUNTER — Encounter: Payer: Self-pay | Admitting: Obstetrics and Gynecology

## 2016-10-01 ENCOUNTER — Ambulatory Visit (INDEPENDENT_AMBULATORY_CARE_PROVIDER_SITE_OTHER): Payer: Medicaid Other | Admitting: Obstetrics and Gynecology

## 2016-10-01 VITALS — BP 120/80 | HR 104 | Ht 63.0 in | Wt 151.0 lb

## 2016-10-01 DIAGNOSIS — Z30431 Encounter for routine checking of intrauterine contraceptive device: Secondary | ICD-10-CM

## 2016-10-01 NOTE — Progress Notes (Signed)
    IUD String Check  Subjctive: [name] presents for IUD string check.  She had a Mirena placed 4 weeks ago.  Since placement of her IUD she had intermittent vaginal bleeding.  She denies cramping or discomfort.  She has had intercourse since placement.  She has not checked the strings.  She denies any fever, chills, nausea, vomiting, or other complaints.    Objective: BP 120/80   Pulse (!) 104   Ht 5\' 3"  (1.6 m)   Wt 151 lb (68.5 kg)   BMI 26.75 kg/m  Physical Exam  Constitutional: She is oriented to person, place, and time. She appears well-developed and well-nourished. No distress.  HENT:  Head: Normocephalic and atraumatic.  Nose: Nose normal.  Eyes: Conjunctivae are normal.  Neck: Normal range of motion.  Cardiovascular: Normal rate, regular rhythm and normal heart sounds.   Pulmonary/Chest: Effort normal and breath sounds normal.  Abdominal: Soft. She exhibits no distension. There is no tenderness. There is no rebound and no guarding.  Genitourinary: Uterus normal. Pelvic exam was performed with patient supine. There is no lesion on the right labia. There is no lesion on the left labia. Uterus is not deviated, not enlarged and not tender. Cervix exhibits no motion tenderness and no discharge. Right adnexum displays no mass. Left adnexum displays no mass. No tenderness or bleeding in the vagina. No vaginal discharge found.  Genitourinary Comments: IUD strings visible  Musculoskeletal: Normal range of motion.  Lymphadenopathy:       Right: No inguinal adenopathy present.       Left: No inguinal adenopathy present.  Neurological: She is alert and oriented to person, place, and time.  Skin: Skin is warm and dry. No rash noted.  Psychiatric: She has a normal mood and affect. Her behavior is normal. Judgment normal.   Female chaperone was present for the entirety of the pelvic exam  Assessment: 31 y.o. year old female status post prior Mirena IUD placement 4 week  ago, doing well.  Plan: 1.  The patient was given instructions to check her IUD strings monthly and call with any problems or concerns.  She should call for fevers, chills, abnormal vaginal discharge, pelvic pain, or other complaints. 2.  She will return for a annual exam in 1 year.  All questions answered.  Thomasene MohairStephen Quinetta Shilling, MD 10/01/2016 4:03 PM

## 2016-11-02 ENCOUNTER — Emergency Department: Payer: Medicaid Other

## 2016-11-02 ENCOUNTER — Emergency Department
Admission: EM | Admit: 2016-11-02 | Discharge: 2016-11-02 | Disposition: A | Discharge status: Home / Self Care | Payer: Medicaid Other | Attending: Emergency Medicine | Admitting: Emergency Medicine

## 2016-11-02 DIAGNOSIS — Z87891 Personal history of nicotine dependence: Secondary | ICD-10-CM | POA: Insufficient documentation

## 2016-11-02 DIAGNOSIS — Z79899 Other long term (current) drug therapy: Secondary | ICD-10-CM | POA: Diagnosis not present

## 2016-11-02 DIAGNOSIS — J069 Acute upper respiratory infection, unspecified: Secondary | ICD-10-CM

## 2016-11-02 DIAGNOSIS — R0602 Shortness of breath: Secondary | ICD-10-CM | POA: Diagnosis present

## 2016-11-02 MED ORDER — PSEUDOEPH-BROMPHEN-DM 30-2-10 MG/5ML PO SYRP: 5.0000 mL | ORAL_SOLUTION | Freq: Four times a day (QID) | ORAL | 0 refills | Status: DC | PRN

## 2016-11-02 NOTE — ED Provider Notes (Signed)
[facility] Emergency Department Provider Note  ____________________________________________   First MD Initiated Contact with Patient 11/02/16 1323     (approximate)  I have reviewed the triage vital signs and the nursing notes.   HISTORY  Chief Complaint Shortness of Breath   HPI Karen Duarte is a 31 y.o. female is here with 2 day history of cough, congestion, and feeling short of breath. Patient states that she has had bronchitis in the past. She is not taking any antipyretics in the last 12 hours. She has taken an Mucinex and Delsym without any relief of her symptoms. Patient smokes one half pack cigarettes per day. She rates her discomfort as 7 out of 10.   Past Medical History:  Diagnosis Date  . GERD (gastroesophageal reflux disease)    with pregnancy    Patient Active Problem List   Diagnosis Date Noted  . Normal labor 06/16/2016    Past Surgical History:  Procedure Laterality Date  . DILATION AND CURETTAGE OF UTERUS     Therapuetic abortion  . HAND SURGERY    . WISDOM TOOTH EXTRACTION      Prior to Admission medications   Medication Sig Start Date End Date Taking? Authorizing Provider  acetaminophen (TYLENOL) 325 MG tablet Take 650 mg by mouth every 6 (six) hours as needed for mild pain, fever or headache.    [provider]  brompheniramine-pseudoephedrine-DM 30-2-10 MG/5ML syrup Take 5 mLs by mouth 4 (four) times daily as needed. 11/02/16   Tommi RumpsSummers, Lamyah Creed L, PA-C  levonorgestrel (MIRENA) 20 MCG/24HR IUD 1 Intra Uterine Device (1 each total) by Intrauterine route once. 09/03/16 09/03/16  Conard NovakJackson, Stephen D, MD    Allergies Latex  Family History  Problem Relation Age of Onset  . Diabetes Mellitus I Father   . Diabetes Mellitus II Paternal Grandmother   . Hypertension Paternal Grandmother     Social History Social History  Substance Use Topics  . Smoking status: Former Smoker    Packs/day: 0.50    Years: 12.00      Types: Cigarettes    Quit date: 05/04/2016  . Smokeless tobacco: Former NeurosurgeonUser    Quit date: 09/02/2014     Comment: last used 1 week ago  . Alcohol use No    Review of Systems Constitutional: Subjective fever/no chills Eyes: No visual changes. ENT: Positive nasal congestion. Cardiovascular: Denies chest pain. Respiratory: Positive shortness of breath. Positive nonproductive cough. Gastrointestinal: No abdominal pain.  No nausea, no vomiting.   Musculoskeletal: Negative for back pain. Skin: Negative for rash. Neurological: Negative for headaches, focal weakness or numbness.   ____________________________________________   PHYSICAL EXAM:  VITAL SIGNS: ED Triage Vitals  Enc Vitals Group     BP 11/02/16 1320 (!) 124/59     Pulse Rate 11/02/16 1320 88     Resp 11/02/16 1320 16     Temp 11/02/16 1320 98.7 F (37.1 C)     Temp Source 11/02/16 1320 Oral     SpO2 11/02/16 1320 99 %     Weight 11/02/16 1222 160 lb (72.6 kg)     Height 11/02/16 1222 5\' 3"  (1.6 m)     Head Circumference --      Peak Flow --      Pain Score 11/02/16 1251 7     Pain Loc --      Pain Edu? --      Excl. in GC? --    Constitutional: Alert and oriented. Well  appearing and in no acute distress. Eyes: Conjunctivae are normal.  Head: Atraumatic. Nose: Moderate congestion/no rhinnorhea.  EACs are clear. TMs are dull without erythema or injection. Mouth/Throat: Mucous membranes are moist.  Oropharynx non-erythematous. Neck: No stridor.   Hematological/Lymphatic/Immunilogical: No cervical lymphadenopathy. Cardiovascular: Normal rate, regular rhythm. Grossly normal heart sounds.  Good peripheral circulation. Respiratory: Normal respiratory effort.  No retractions. Lungs CTAB. Musculoskeletal: Moves upper and lower extremities without any difficulty. Normal gait was noted. Neurologic:  Normal speech and language. No gross focal neurologic deficits are appreciated. No gait instability. Skin:  Skin is warm,  dry and intact. No rash noted. Psychiatric: Mood and affect are normal. Speech and behavior are normal.  ____________________________________________   LABS (all labs ordered are listed, but only abnormal results are displayed)  Labs Reviewed - No data to display  RADIOLOGY  Dg Chest 2 View  Result Date: 11/02/2016 CLINICAL DATA:  Shortness of breath, congestion, cough. Afebrile. Smoker. EXAM: CHEST  2 VIEW COMPARISON:  Chest x-ray dated 05/14/2016. FINDINGS: The heart size and mediastinal contours are within normal limits. Both lungs are clear. The visualized skeletal structures are unremarkable. IMPRESSION: No active cardiopulmonary disease. No evidence of pneumonia or pulmonary edema. Electronically Signed   By: Bary RichardStan  Maynard M.D.   On: 11/02/2016 12:50    ____________________________________________   PROCEDURES  Procedure(s) performed: None  Procedures  Critical Care performed: No  ____________________________________________   INITIAL IMPRESSION / ASSESSMENT AND PLAN / ED COURSE  Pertinent labs & imaging results that were available during my care of the patient were reviewed by me and considered in my medical decision making (see chart for details).  Patient was given a prescription for Bromfed-DM to see if this relieves her symptoms better than what she has at home. She is encouraged to drink lots of fluids and decrease her smoking. Tylenol or ibuprofen if needed for headache or body aches. She'll [facility] if any continued problems.      ____________________________________________   FINAL CLINICAL IMPRESSION(S) / ED DIAGNOSES  Final diagnoses:  Acute upper respiratory infection      NEW MEDICATIONS STARTED DURING THIS VISIT:  Discharge Medication List as of 11/02/2016  1:36 PM    START taking these medications   Details  brompheniramine-pseudoephedrine-DM 30-2-10 MG/5ML syrup Take 5 mLs by mouth 4 (four) times daily as needed.,  Starting Sat 11/02/2016, Print         Note:  This document was prepared using Dragon voice recognition software and may include unintentional dictation errors.    Tommi RumpsSummers, Damyan Corne L, PA-C 11/02/16 1346    Sharyn CreamerQuale, Mark, MD 11/02/16 1530

## 2016-11-02 NOTE — Discharge Instructions (Signed)
Begin taking Bromfed DM as needed for cough and congestion. Also you may use saline nose spray to relieve some of the mucus and your nose. Tylenol or ibuprofen as needed for fever or body aches. Increase fluids. Decrease smoking. Follow[facility] acute-care if any continued problems.

## 2016-11-02 NOTE — ED Triage Notes (Signed)
Pt came to ED via pov c/o sob. Reports started last night, pt reports "I think I have bronchitis." Afebrile. VS stable.

## 2016-11-22 ENCOUNTER — Emergency Department
Admission: EM | Admit: 2016-11-22 | Discharge: 2016-11-22 | Disposition: A | Discharge status: Home / Self Care | Payer: Medicaid Other | Attending: Emergency Medicine | Admitting: Emergency Medicine

## 2016-11-22 ENCOUNTER — Encounter: Payer: Self-pay | Admitting: *Deleted

## 2016-11-22 ENCOUNTER — Emergency Department: Payer: Medicaid Other

## 2016-11-22 DIAGNOSIS — Z79899 Other long term (current) drug therapy: Secondary | ICD-10-CM | POA: Diagnosis not present

## 2016-11-22 DIAGNOSIS — Z87891 Personal history of nicotine dependence: Secondary | ICD-10-CM | POA: Insufficient documentation

## 2016-11-22 DIAGNOSIS — R3915 Urgency of urination: Secondary | ICD-10-CM | POA: Diagnosis not present

## 2016-11-22 DIAGNOSIS — Z91040 Latex allergy status: Secondary | ICD-10-CM | POA: Insufficient documentation

## 2016-11-22 DIAGNOSIS — R109 Unspecified abdominal pain: Secondary | ICD-10-CM | POA: Insufficient documentation

## 2016-11-22 LAB — URINALYSIS, COMPLETE (UACMP) WITH MICROSCOPIC
Bacteria, UA: NONE SEEN
Bilirubin Urine: NEGATIVE
GLUCOSE, UA: NEGATIVE mg/dL
HGB URINE DIPSTICK: NEGATIVE
KETONES UR: NEGATIVE mg/dL
Leukocytes, UA: NEGATIVE
NITRITE: NEGATIVE
PROTEIN: NEGATIVE mg/dL
RBC / HPF: NONE SEEN RBC/hpf (ref 0–5)
Specific Gravity, Urine: 1.02 (ref 1.005–1.030)
pH: 6 (ref 5.0–8.0)

## 2016-11-22 LAB — POCT PREGNANCY, URINE: Preg Test, Ur: NEGATIVE

## 2016-11-22 MED ORDER — HYDROCODONE-ACETAMINOPHEN 5-325 MG PO TABS: 1.0000 | ORAL_TABLET | ORAL | 0 refills | Status: DC | PRN

## 2016-11-22 MED ORDER — ONDANSETRON 4 MG PO TBDP: ORAL_TABLET | ORAL | 0 refills | Status: DC

## 2016-11-22 MED ORDER — OXYCODONE-ACETAMINOPHEN 5-325 MG PO TABS
1.0000 | ORAL_TABLET | ORAL | Status: DC | PRN
  Administered 2016-11-22: 1 via ORAL

## 2016-11-22 MED ORDER — ONDANSETRON 4 MG PO TBDP
4.0000 mg | ORAL_TABLET | Freq: Once | ORAL | Status: AC
  Administered 2016-11-22: 4 mg via ORAL
  Filled 2016-11-22: qty 1

## 2016-11-22 MED ORDER — DOCUSATE SODIUM 100 MG PO CAPS: ORAL_CAPSULE | ORAL | 0 refills | Status: DC

## 2016-11-22 MED ORDER — OXYCODONE-ACETAMINOPHEN 5-325 MG PO TABS
ORAL_TABLET | ORAL | Status: AC
  Administered 2016-11-22: 1 via ORAL
  Filled 2016-11-22: qty 1

## 2016-11-22 MED ORDER — OXYCODONE-ACETAMINOPHEN 5-325 MG PO TABS
2.0000 | ORAL_TABLET | Freq: Once | ORAL | Status: AC
Start: 2016-11-22 — End: 2016-11-22
  Administered 2016-11-22: 2 via ORAL
  Filled 2016-11-22: qty 2

## 2016-11-22 NOTE — ED Notes (Signed)
Pt. Going home with family. 

## 2016-11-22 NOTE — ED Provider Notes (Signed)
[facility] Emergency Department Provider Note  ____________________________________________   First MD Initiated Contact with Patient 11/22/16 1710     (approximate)  I have reviewed the triage vital signs and the nursing notes.   HISTORY  Chief Complaint Flank Pain    HPI Karen Duarte is a 31 y.o. female with no significant chronic medical history although she does report having had kidney stones in the past as a teenager who presents for evaluation of about 3 days of intermittent sharp and stabbing right flank pain.  She describes it as a pressure as well.  Shestates that she has had a feeling of urinary urgency, and most recently she felt like something was "blocking my pee hole", but then she was able to urinate again.  She continues to have intermittent pain in the right flank that radiates to the right lower quadrant of her abdomen.  She denies dysuria.  She has hada little bit more vaginal discharge than usual but she had a baby for 5 months ago  She has not had a menstrual cycle recently but she has a Mirena IUD.  She denies fever/chills, chest pain, shortness of breath, nausea, vomiting.  Nothing in particular makes her symptoms better and moving around makes it worse.   Past Medical History:  Diagnosis Date  . GERD (gastroesophageal reflux disease)    with pregnancy    Patient Active Problem List   Diagnosis Date Noted  . Normal labor 06/16/2016    Past Surgical History:  Procedure Laterality Date  . DILATION AND CURETTAGE OF UTERUS     Therapuetic abortion  . HAND SURGERY    . WISDOM TOOTH EXTRACTION      Prior to Admission medications   Medication Sig Start Date End Date Taking? Authorizing Provider  acetaminophen (TYLENOL) 325 MG tablet Take 650 mg by mouth every 6 (six) hours as needed for mild pain, fever or headache.   Yes [provider]  brompheniramine-pseudoephedrine-DM 30-2-10 MG/5ML syrup Take 5 mLs by mouth  4 (four) times daily as needed. 11/02/16  Yes Tommi Rumps, PA-C  docusate sodium (COLACE) 100 MG capsule Take 1 tablet once or twice daily as needed for constipation while taking narcotic pain medicine 11/22/16   Loleta Rose, MD  HYDROcodone-acetaminophen (NORCO/VICODIN) 5-325 MG tablet Take 1-2 tablets by mouth every 4 (four) hours as needed for moderate pain. 11/22/16   Loleta Rose, MD  levonorgestrel (MIRENA) 20 MCG/24HR IUD 1 Intra Uterine Device (1 each total) by Intrauterine route once. Patient not taking: Reported on 11/22/2016 09/03/16 09/03/16  Conard Novak, MD  ondansetron (ZOFRAN ODT) 4 MG disintegrating tablet Allow 1-2 tablets to dissolve in your mouth every 8 hours as needed for nausea/vomiting 11/22/16   Loleta Rose, MD    Allergies Latex  Family History  Problem Relation Age of Onset  . Diabetes Mellitus I Father   . Diabetes Mellitus II Paternal Grandmother   . Hypertension Paternal Grandmother     Social History Social History  Substance Use Topics  . Smoking status: Former Smoker    Packs/day: 0.50    Years: 12.00    Types: Cigarettes    Quit date: 05/04/2016  . Smokeless tobacco: Former Neurosurgeon    Quit date: 09/02/2014     Comment: last used 1 week ago  . Alcohol use No    Review of Systems Constitutional: No fever/chills Eyes: No visual changes. ENT: No sore throat. Cardiovascular: Denies chest pain. Respiratory: Denies shortness  of breath. Gastrointestinal: right-sided flank pain radiating sometimes to her right lower abdomen.  Denies nausea, vomiting, diarrhea Genitourinary: Negative for dysuria. denies hematuria.  No menstrual cycle for months but has a Mirena IUD Musculoskeletal: Negative for neck pain.  right flank pain Integumentary: Negative for rash. Neurological: Negative for headaches, focal weakness or numbness.   ____________________________________________   PHYSICAL EXAM:  VITAL SIGNS: ED Triage Vitals  Enc Vitals Group     BP  11/22/16 1258 128/80     Pulse Rate 11/22/16 1258 100     Resp 11/22/16 1258 18     Temp 11/22/16 1258 98.6 F (37 C)     Temp Source 11/22/16 1258 Oral     SpO2 11/22/16 1258 98 %     Weight 11/22/16 1259 70.3 kg (155 lb)     Height 11/22/16 1259 1.6 m (5\' 3" )     Head Circumference --      Peak Flow --      Pain Score 11/22/16 1258 9     Pain Loc --      Pain Edu? --      Excl. in GC? --     Constitutional: Alert and oriented. Well appearing and in no acute distress. Eyes: Conjunctivae are normal.  Head: Atraumatic. Nose: No congestion/rhinnorhea. Mouth/Throat: Mucous membranes are moist. Neck: No stridor.  No meningeal signs.   Cardiovascular: Normal rate, regular rhythm. Good peripheral circulation. Grossly normal heart sounds. Respiratory: Normal respiratory effort.  No retractions. Lungs CTAB. Gastrointestinal: Soft and nontender. No distention.  GU:  Deferred Musculoskeletal: moderate to severe right CVA tenderness. No lower extremity tenderness nor edema. No gross deformities of extremities. Neurologic:  Normal speech and language. No gross focal neurologic deficits are appreciated.  Skin:  Skin is warm, dry and intact. No rash noted. Psychiatric: Mood and affect are normal. Speech and behavior are normal.  ____________________________________________   LABS (all labs ordered are listed, but only abnormal results are displayed)  Labs Reviewed  URINALYSIS, COMPLETE (UACMP) WITH MICROSCOPIC - Abnormal; Notable for the following:       Result Value   Color, Urine YELLOW (*)    APPearance CLEAR (*)    Squamous Epithelial / LPF 0-5 (*)    All other components within normal limits  POC URINE PREG, ED  POCT PREGNANCY, URINE   ____________________________________________  EKG  None - EKG not ordered by ED physician ____________________________________________  RADIOLOGY   Ct Renal Stone Study  Result Date: 11/22/2016 CLINICAL DATA:  Right-sided flank pain  for 3 days. Flank disease, stone suspected. EXAM: CT ABDOMEN AND PELVIS WITHOUT CONTRAST TECHNIQUE: Multidetector CT imaging of the abdomen and pelvis was performed following the standard protocol without IV contrast. COMPARISON:  None. FINDINGS: Lower chest: The lung bases are clear without focal nodule, mass, or airspace disease. The heart size is normal. No significant pleural or pericardial effusion is present. Hepatobiliary: The liver is of normal size and density. No focal lesions are present. The common bile duct and gallbladder are normal. Pancreas: Unremarkable. No pancreatic ductal dilatation or surrounding inflammatory changes. Spleen: Normal in size without focal abnormality. Adrenals/Urinary Tract: The adrenal glands are normal bilaterally. The kidneys are unremarkable. There is no stone or mass lesion. No hydronephrosis is present. The ureters are within normal limits bilaterally. The urinary bladder is unremarkable. Stomach/Bowel: The stomach and duodenum are within normal limits. Small bowel is unremarkable. The appendix is visualized and normal. The ascending and transverse colon are within normal limits.  The descending and sigmoid colon are unremarkable. Vascular/Lymphatic: No significant vascular findings are present. No enlarged abdominal or pelvic lymph nodes. Reproductive: IUD is in place. Uterus and adnexa are otherwise unremarkable. Other: No abdominal wall hernia or abnormality. No abdominopelvic ascites. Musculoskeletal: Vertebral body heights and alignment are maintained. No focal lytic or blastic lesions are present. IMPRESSION: 1. No nephrolithiasis or urinary tract obstruction. 2. No acute or focal lesion to explain the patient's right-sided flank pain. The bowel including appendix is within normal limits. 3. IUD in place. Electronically Signed   By: Marin Roberts M.D.   On: 11/22/2016 17:51    ____________________________________________   PROCEDURES  Critical Care  performed: No   Procedure(s) performed:   Procedures   ____________________________________________   INITIAL IMPRESSION / ASSESSMENT AND PLAN / ED COURSE  Pertinent labs & imaging results that were available during my care of the patient were reviewed by me and considered in my medical decision making (see chart for details).  the patient is well-appearing and appears uncomfortable but is not in acute distress.  Her vital signs are stable and she is afebrile.  Her urinalysis is unremarkable with no hematuria nor evidence of infection.  We discussed imaging versus no imaging and decided to proceed with a CT scan to see if she has any evidence of kidney stone or any other acute abdominal abnormality.  Her signs and symptoms are not consistent with ovarian torsion  PID is possible although less likely given the very specific pain she is having in her right flank, and she just recently gave birth.  her abdomen is nontender and nondistended.  Overall I am reassured by her exam.  We discussed a pelvic exam but she would rather hold off and avoid one if possible.   Clinical Course as of Nov 23 1902  Fri Nov 22, 2016  1707 I reviewed the patient's prescription history over the last 12 months in the multi-state controlled substances database(s) that includes [place], [place], [place], [place], [place], [place], [place], [place], [place], [place], [place], [place], [place], and [place].  Results were notable for 3 prescriptions during this time, 2 of which were prescribed by local OB/GYN's, and the last prescription was a full 5 months ago.   [CF]  1833 CT exam is unremarkable.  I discussed it with the patient and she states that she is feeling better although she still feels some right-sided flank pain.we discussed the possibility that she may have already passed the stone.  I again offered a pelvic exam and evaluation but she would rather avoid it if it is not  absolutely necessary, and in this case I do not feel it is absolutely necessary. She had a baby 4-5 months ago but her abdomen is soft and nontender and her CT scan shows no acute abnormalities.  we decided to treat empirically with pain medication and nausea medicine and I gave strict return precautions if she develops new or worsening symptoms that are concerning to her.  She agrees with the plan.  [CF]    Clinical Course User Index [CF] Loleta Rose, MD    ____________________________________________  FINAL CLINICAL IMPRESSION(S) / ED DIAGNOSES  Final diagnoses:  Acute right flank pain     MEDICATIONS GIVEN DURING THIS VISIT:  Medications  oxyCODONE-acetaminophen (PERCOCET/ROXICET) 5-325 MG per tablet 1 tablet (1 tablet Oral Given 11/22/16 1545)  oxyCODONE-acetaminophen (PERCOCET/ROXICET) 5-325 MG per tablet 2 tablet (2 tablets Oral Given 11/22/16 1810)  ondansetron (ZOFRAN-ODT) disintegrating tablet  4 mg (4 mg Oral Given 11/22/16 1810)     NEW OUTPATIENT MEDICATIONS STARTED DURING THIS VISIT:  New Prescriptions   DOCUSATE SODIUM (COLACE) 100 MG CAPSULE    Take 1 tablet once or twice daily as needed for constipation while taking narcotic pain medicine   HYDROCODONE-ACETAMINOPHEN (NORCO/VICODIN) 5-325 MG TABLET    Take 1-2 tablets by mouth every 4 (four) hours as needed for moderate pain.   ONDANSETRON (ZOFRAN ODT) 4 MG DISINTEGRATING TABLET    Allow 1-2 tablets to dissolve in your mouth every 8 hours as needed for nausea/vomiting    Modified Medications   No medications on file    Discontinued Medications   No medications on file     Note:  This document was prepared using Dragon voice recognition software and may include unintentional dictation errors.    Loleta Rose, MD 11/22/16 [phone]

## 2016-11-22 NOTE — Discharge Instructions (Signed)
You have been seen in the Emergency Department (ED) today for pain that we believe based on your workup, is caused by kidney stones.  Although your CT scan did not show any kidney stones, it may be that you already passed the stone.  As we have discussed, please drink plenty of fluids.  Please make a follow up appointment with the physician(s) listed elsewhere in this documentation.  You may take pain medication as needed but ONLY as prescribed.  Please also take your prescribed Flomax daily.  We also recommend that you take over-the-counter ibuprofen regularly according to label instructions over the next 5 days.  Take it with meals to minimize stomach discomfort.  Please see your doctor as soon as possible as stones may take 1-3 weeks to pass and you may require additional care or medications.  Do not drink alcohol, drive or participate in any other potentially dangerous activities while taking opiate pain medication as it may make you sleepy. Do not take this medication with any other sedating medications, either prescription or over-the-counter. If you were prescribed Percocet or Vicodin, do not take these with acetaminophen (Tylenol) as it is already contained within these medications.   Take Norco as needed for severe pain.  This medication is an opiate (or narcotic) pain medication and can be habit forming.  Use it as little as possible to achieve adequate pain control.  Do not use or use it with extreme caution if you have a history of opiate abuse or dependence.  If you are on a pain contract with your primary care doctor or a pain specialist, be sure to let them know you were prescribed this medication today from the [facility] Emergency Department.  This medication is intended for your use only - do not give any to anyone else and keep it in a secure place where nobody else, especially children, have access to it.  It will also cause or worsen constipation, so you may want to consider  taking an over-the-counter stool softener while you are taking this medication.  Return to the Emergency Department (ED) or call your doctor if you have any worsening pain, fever, painful urination, are unable to urinate, or develop other symptoms that concern you.

## 2016-11-22 NOTE — ED Triage Notes (Signed)
Pt complains of right sided flank pain for three days , pt denies fever, pt has a history of kidney stones

## 2017-11-28 ENCOUNTER — Encounter: Payer: Self-pay | Admitting: Emergency Medicine

## 2017-11-28 ENCOUNTER — Emergency Department: Payer: Medicaid Other

## 2017-11-28 ENCOUNTER — Emergency Department
Admission: EM | Admit: 2017-11-28 | Discharge: 2017-11-28 | Disposition: A | Discharge status: Home / Self Care | Payer: Medicaid Other | Attending: Emergency Medicine | Admitting: Emergency Medicine

## 2017-11-28 ENCOUNTER — Other Ambulatory Visit: Payer: Self-pay

## 2017-11-28 DIAGNOSIS — W108XXA Fall (on) (from) other stairs and steps, initial encounter: Secondary | ICD-10-CM | POA: Insufficient documentation

## 2017-11-28 DIAGNOSIS — W19XXXA Unspecified fall, initial encounter: Secondary | ICD-10-CM

## 2017-11-28 DIAGNOSIS — S161XXA Strain of muscle, fascia and tendon at neck level, initial encounter: Secondary | ICD-10-CM

## 2017-11-28 DIAGNOSIS — S20212A Contusion of left front wall of thorax, initial encounter: Secondary | ICD-10-CM | POA: Insufficient documentation

## 2017-11-28 DIAGNOSIS — Y9389 Activity, other specified: Secondary | ICD-10-CM | POA: Diagnosis not present

## 2017-11-28 DIAGNOSIS — Y929 Unspecified place or not applicable: Secondary | ICD-10-CM | POA: Diagnosis not present

## 2017-11-28 DIAGNOSIS — Y92009 Unspecified place in unspecified non-institutional (private) residence as the place of occurrence of the external cause: Secondary | ICD-10-CM

## 2017-11-28 DIAGNOSIS — S299XXA Unspecified injury of thorax, initial encounter: Secondary | ICD-10-CM | POA: Diagnosis present

## 2017-11-28 DIAGNOSIS — Y999 Unspecified external cause status: Secondary | ICD-10-CM | POA: Diagnosis not present

## 2017-11-28 DIAGNOSIS — Z87891 Personal history of nicotine dependence: Secondary | ICD-10-CM | POA: Diagnosis not present

## 2017-11-28 MED ORDER — HYDROCODONE-ACETAMINOPHEN 5-325 MG PO TABS
1.0000 | ORAL_TABLET | Freq: Once | ORAL | Status: AC
  Administered 2017-11-28: 1 via ORAL
  Filled 2017-11-28: qty 1

## 2017-11-28 MED ORDER — HYDROCODONE-ACETAMINOPHEN 5-325 MG PO TABS: 1.0000 | ORAL_TABLET | Freq: Four times a day (QID) | ORAL | 0 refills | Status: DC | PRN

## 2017-11-28 NOTE — Discharge Instructions (Signed)
Continue your regular medication.  Follow-up with your primary care provider if any continued problems.  Norco 1 every 6 hours as needed for moderate pain.  Do not drive or operate machinery while taking this medication.  Ice and elevate your ankle as needed for discomfort.  You may also apply ice packs to your ribs and chest wall as needed for discomfort.

## 2017-11-28 NOTE — ED Triage Notes (Signed)
First Nurse Note:  Larey SeatFell down 12-13 hardwood steps 2 days ago.  Arrives today with chest and left shoulder pain.  Pain worse with inspiration and moving arm.

## 2017-11-28 NOTE — ED Triage Notes (Addendum)
Pt fell backward down 12 stairs two days ago.  Pt ambulatory with steady gait. No LOC. No blood thinners. C/o right ankle, left shoulder, and sternal pain.  Pain to right and left neck pain, no cervical pain. No back or head pain.  Pain better when left arm held to chest level.  Worse with movement and palpation

## 2017-11-28 NOTE — ED Provider Notes (Signed)
[facility] Emergency Department Provider Note  ____________________________________________   First MD Initiated Contact with Patient 11/28/17 1535     (approximate)  I have reviewed the triage vital signs and the nursing notes.   HISTORY  Chief Complaint Fall   HPI Karen Duarte is a 32 y.o. female presents to the emergency room with complaint of left chest wall pain after she fell down 12 steps 2 days ago.  Patient states that she has pain with deep inspiration.  She has continued to ambulate without assistance.  She complains of some cervical muscle tenderness but denies neck pain.  She also has no pain to her thoracic or lumbar spine.  Patient states that she is most concerned about the sternum and left ribs.  She denies any head injury or loss of consciousness.  She is continue taking her medication of gabapentin and ibuprofen.  She rates her pain as a 1/10.  Past Medical History:  Diagnosis Date  . GERD (gastroesophageal reflux disease)    with pregnancy    Patient Active Problem List   Diagnosis Date Noted  . Normal labor 06/16/2016    Past Surgical History:  Procedure Laterality Date  . DILATION AND CURETTAGE OF UTERUS     Therapuetic abortion  . HAND SURGERY    . WISDOM TOOTH EXTRACTION      Prior to Admission medications   Medication Sig Start Date End Date Taking? Authorizing Provider  acetaminophen (TYLENOL) 325 MG tablet Take 650 mg by mouth every 6 (six) hours as needed for mild pain, fever or headache.    [provider]  HYDROcodone-acetaminophen (NORCO/VICODIN) 5-325 MG tablet Take 1 tablet by mouth every 6 (six) hours as needed for moderate pain. 11/28/17   Tommi Rumps, PA-C  levonorgestrel (MIRENA) 20 MCG/24HR IUD 1 Intra Uterine Device (1 each total) by Intrauterine route once. Patient not taking: Reported on 11/22/2016 09/03/16 09/03/16  Conard Novak, MD    Allergies Latex  Family History  Problem  Relation Age of Onset  . Diabetes Mellitus I Father   . Diabetes Mellitus II Paternal Grandmother   . Hypertension Paternal Grandmother     Social History Social History   Tobacco Use  . Smoking status: Former Smoker    Packs/day: 0.50    Years: 12.00    Pack years: 6.00    Types: Cigarettes    Last attempt to quit: 05/04/2016    Years since quitting: 1.5  . Smokeless tobacco: Former Neurosurgeon    Quit date: 09/02/2014  . Tobacco comment: last used 1 week ago  Substance Use Topics  . Alcohol use: No  . Drug use: No    Review of Systems Constitutional: No fever/chills Eyes: No visual changes. ENT: No trauma. Cardiovascular: Denies chest pain. Respiratory: Denies shortness of breath. Gastrointestinal: No abdominal pain.  No nausea, no vomiting.  Genitourinary: Negative for hematuria. Musculoskeletal: Negative for back pain.  Negative for cervical pain. Skin: Negative for rash. Neurological: Negative for headaches, focal weakness or numbness.  ____________________________________________   PHYSICAL EXAM:  VITAL SIGNS: ED Triage Vitals  Enc Vitals Group     BP 11/28/17 1506 124/84     Pulse Rate 11/28/17 1506 (!) 105     Resp 11/28/17 1506 16     Temp 11/28/17 1505 99 F (37.2 C)     Temp Source 11/28/17 1505 Oral     SpO2 11/28/17 1506 100 %     Weight 11/28/17 1505  130 lb (59 kg)     Height 11/28/17 1505 5\' 2"  (1.575 m)     Head Circumference --      Peak Flow --      Pain Score 11/28/17 1505 1     Pain Loc --      Pain Edu? --      Excl. in GC? --     Constitutional: Alert and oriented. Well appearing and in no acute distress. Eyes: Conjunctivae are normal. PERRL. EOMI. Head: Atraumatic. Nose: No congestion/rhinnorhea.  No evidence of trauma. Neck: No stridor.  Nontender cervical spine to palpation posteriorly.  Nontender trapezius and cervical muscles to palpation.  No ecchymosis or abrasions were noted.  Soft tissue swelling present.  And range of motion is  without restriction or reproduction of pain. Cardiovascular: Normal rate, regular rhythm. Grossly normal heart sounds.  Good peripheral circulation. Respiratory: Normal respiratory effort.  No retractions. Lungs CTAB.  On palpation of the left lateral ribs there is moderate tenderness.  No obvious deformity or soft tissue swelling present.  No ecchymosis present.  Moderate tenderness on palpation of the anterior chest wall without deformity or soft tissue injury. Gastrointestinal: Soft and nontender. No distention.  Musculoskeletal: Examination of left shoulder there is some soft tissue tenderness.  Range of motion is restricted secondary to discomfort noted to the left lateral rib cage.  No crepitus is noted.  No ecchymosis or abrasions were seen.  On examination of the lower extremities there is some tenderness on palpation of the right ankle but no edema or skin discoloration.  Patient is able to flex and extend and bear weight without any difficulties. Neurologic:  Normal speech and language. No gross focal neurologic deficits are appreciated.  Skin:  Skin is warm, dry and intact.  Psychiatric: Mood and affect are normal. Speech and behavior are normal.  ____________________________________________   LABS (all labs ordered are listed, but only abnormal results are displayed)  Labs Reviewed - No data to display  RADIOLOGY  ED MD interpretation:   Left ribs negative for fracture.  Official radiology report(s): Dg Ribs Unilateral W/chest Left  Result Date: 11/28/2017 CLINICAL DATA:  Left-sided chest pain after fall 2 days ago. EXAM: LEFT RIBS AND CHEST - 3+ VIEW COMPARISON:  Radiographs of November 02, 2016. FINDINGS: No fracture or other bone lesions are seen involving the ribs. There is no evidence of pneumothorax or pleural effusion. Both lungs are clear. Heart size and mediastinal contours are within normal limits. IMPRESSION: Normal left ribs.  No acute cardiopulmonary abnormality seen.  Electronically Signed   By: Lupita RaiderJames  Green Jr, M.D.   On: 11/28/2017 16:32    ____________________________________________   PROCEDURES  Procedure(s) performed: None  Procedures  Critical Care performed: No  ____________________________________________   INITIAL IMPRESSION / ASSESSMENT AND PLAN / ED COURSE  As part of my medical decision making, I reviewed the following data within the electronic MEDICAL RECORD NUMBER Notes from prior ED visits and [place] Controlled Substance Database  Patient presents with left-sided chest wall pain after falling 2 days ago.  Patient denies any head injury or loss of consciousness.  She has some minor pains but has continued to ambulate without any assistance.  Patient was reassured and given Norco 5/325 while waiting for x-ray results.  X-rays were reported as negative.  Patient is continue taking Norco every 6 hours as needed for pain.  She will follow-up with her PCP if any continued problems.  She is encouraged to use  ice as needed for discomfort.  ____________________________________________   FINAL CLINICAL IMPRESSION(S) / ED DIAGNOSES  Final diagnoses:  Contusion, chest wall, left, initial encounter  Cervical strain, acute, initial encounter  Fall in home, initial encounter     ED Discharge Orders         Ordered    HYDROcodone-acetaminophen (NORCO/VICODIN) 5-325 MG tablet  Every 6 hours PRN     11/28/17 1644           Note:  This document was prepared using Dragon voice recognition software and may include unintentional dictation errors.    Tommi Rumps, PA-C 11/28/17 1827    Emily Filbert, MD 12/01/17 [phone]

## 2018-03-09 ENCOUNTER — Other Ambulatory Visit: Payer: Self-pay

## 2018-03-09 ENCOUNTER — Emergency Department: Payer: Medicaid Other

## 2018-03-09 ENCOUNTER — Encounter: Payer: Self-pay | Admitting: Emergency Medicine

## 2018-03-09 ENCOUNTER — Emergency Department
Admission: EM | Admit: 2018-03-09 | Discharge: 2018-03-09 | Disposition: A | Discharge status: Home / Self Care | Payer: Medicaid Other | Attending: Emergency Medicine | Admitting: Emergency Medicine

## 2018-03-09 DIAGNOSIS — Y998 Other external cause status: Secondary | ICD-10-CM | POA: Insufficient documentation

## 2018-03-09 DIAGNOSIS — Y9383 Activity, rough housing and horseplay: Secondary | ICD-10-CM | POA: Diagnosis not present

## 2018-03-09 DIAGNOSIS — S99912A Unspecified injury of left ankle, initial encounter: Secondary | ICD-10-CM | POA: Diagnosis present

## 2018-03-09 DIAGNOSIS — W228XXA Striking against or struck by other objects, initial encounter: Secondary | ICD-10-CM | POA: Insufficient documentation

## 2018-03-09 DIAGNOSIS — Y92013 Bedroom of single-family (private) house as the place of occurrence of the external cause: Secondary | ICD-10-CM | POA: Insufficient documentation

## 2018-03-09 DIAGNOSIS — F17210 Nicotine dependence, cigarettes, uncomplicated: Secondary | ICD-10-CM | POA: Diagnosis not present

## 2018-03-09 DIAGNOSIS — S9002XA Contusion of left ankle, initial encounter: Secondary | ICD-10-CM | POA: Diagnosis not present

## 2018-03-09 MED ORDER — MELOXICAM 7.5 MG PO TABS
15.0000 mg | ORAL_TABLET | Freq: Once | ORAL | Status: AC
  Administered 2018-03-09: 15 mg via ORAL
  Filled 2018-03-09: qty 2

## 2018-03-09 MED ORDER — MELOXICAM 15 MG PO TABS: 15.0000 mg | ORAL_TABLET | Freq: Every day | ORAL | 0 refills | Status: DC

## 2018-03-09 NOTE — ED Triage Notes (Signed)
Pt to ED from home c/o right ankle pain.  States through phone up in the air while sitting on bed and it landed on ankle.  Pt ambulatory with steady gait.

## 2018-03-09 NOTE — ED Provider Notes (Signed)
[facility] Emergency Department Provider Note  ____________________________________________  Time seen: Approximately 8:18 PM  I have reviewed the triage vital signs and the nursing notes.   HISTORY  Chief Complaint Ankle Pain    HPI Karen Duarte is a 32 y.o. female who presents the emergency department complaining of right ankle pain.  Patient was playing a game with her kids yesterday, tossed her phone up, it landed on the medial malleolus.   Patient reports that she has had sharp pain to this area since.  No bruising, no edema, patient is able to bear weight on same.  Patient reports that pain is severe.  No other injury or complaint.   Past Medical History:  Diagnosis Date  . GERD (gastroesophageal reflux disease)    with pregnancy    Patient Active Problem List   Diagnosis Date Noted  . Normal labor 06/16/2016    Past Surgical History:  Procedure Laterality Date  . DILATION AND CURETTAGE OF UTERUS     Therapuetic abortion  . HAND SURGERY    . WISDOM TOOTH EXTRACTION      Prior to Admission medications   Medication Sig Start Date End Date Taking? Authorizing Provider  acetaminophen (TYLENOL) 325 MG tablet Take 650 mg by mouth every 6 (six) hours as needed for mild pain, fever or headache.    [provider]  HYDROcodone-acetaminophen (NORCO/VICODIN) 5-325 MG tablet Take 1 tablet by mouth every 6 (six) hours as needed for moderate pain. 11/28/17   Tommi Rumps, PA-C  levonorgestrel (MIRENA) 20 MCG/24HR IUD 1 Intra Uterine Device (1 each total) by Intrauterine route once. Patient not taking: Reported on 11/22/2016 09/03/16 09/03/16  Conard Novak, MD  meloxicam (MOBIC) 15 MG tablet Take 1 tablet (15 mg total) by mouth daily. 03/09/18   Cuthriell, Delorise Royals, PA-C    Allergies Latex  Family History  Problem Relation Age of Onset  . Diabetes Mellitus I Father   . Diabetes Mellitus II Paternal Grandmother   . Hypertension  Paternal Grandmother     Social History Social History   Tobacco Use  . Smoking status: Current Every Day Smoker    Packs/day: 0.25    Years: 12.00    Pack years: 3.00    Types: Cigarettes    Last attempt to quit: 05/04/2016    Years since quitting: 1.8  . Smokeless tobacco: Former Neurosurgeon    Quit date: 09/02/2014  . Tobacco comment: last used 1 week ago  Substance Use Topics  . Alcohol use: No  . Drug use: No     Review of Systems  Constitutional: No fever/chills Eyes: No visual changes. Cardiovascular: no chest pain. Respiratory: no cough. No SOB. Gastrointestinal: No abdominal pain.  No nausea, no vomiting.   Musculoskeletal: Positive for right ankle injury/pain Skin: Negative for rash, abrasions, lacerations, ecchymosis. Neurological: Negative for headaches, focal weakness or numbness. 10-point ROS otherwise negative.  ____________________________________________   PHYSICAL EXAM:  VITAL SIGNS: ED Triage Vitals  Enc Vitals Group     BP 03/09/18 1938 119/81     Pulse Rate 03/09/18 1938 88     Resp 03/09/18 1938 16     Temp 03/09/18 1938 98.5 F (36.9 C)     Temp Source 03/09/18 1938 Oral     SpO2 03/09/18 1938 100 %     Weight 03/09/18 1939 135 lb (61.2 kg)     Height 03/09/18 1939 5\' 3"  (1.6 m)     Head Circumference --  Peak Flow --      Pain Score 03/09/18 1939 8     Pain Loc --      Pain Edu? --      Excl. in GC? --      Constitutional: Alert and oriented. Well appearing and in no acute distress. Eyes: Conjunctivae are normal. PERRL. EOMI. Head: Atraumatic. Neck: No stridor.    Cardiovascular: Normal rate, regular rhythm. Normal S1 and S2.  Good peripheral circulation. Respiratory: Normal respiratory effort without tachypnea or retractions. Lungs CTAB. Good air entry to the bases with no decreased or absent breath sounds. Musculoskeletal: Full range of motion to all extremities. No gross deformities appreciated.  Visualization of the right ankle  reveals no visible abnormality, edema, ecchymosis.  Full range of motion to the ankle.  Patient is very tender to palpation over the medial and lateral malleolus.  No palpable abnormality.  No ballottement.  Dorsalis pedis pulse intact.  Sensation intact all digits. Neurologic:  Normal speech and language. No gross focal neurologic deficits are appreciated.  Skin:  Skin is warm, dry and intact. No rash noted. Psychiatric: Mood and affect are normal. Speech and behavior are normal. Patient exhibits appropriate insight and judgement.   ____________________________________________   LABS (all labs ordered are listed, but only abnormal results are displayed)  Labs Reviewed - No data to display ____________________________________________  EKG   ____________________________________________  RADIOLOGY I personally viewed and evaluated these images as part of my medical decision making, as well as reviewing the written report by the radiologist.  I concur with radiologist finding of no acute osseous abnormality to the right ankle.  Dg Ankle Complete Right  Result Date: 03/09/2018 CLINICAL DATA:  Medial right ankle pain following an injury yesterday. EXAM: RIGHT ANKLE - COMPLETE 3+ VIEW COMPARISON:  None. FINDINGS: Minimal soft tissue swelling. Normal appearing bones without fracture, dislocation or effusion. IMPRESSION: No fracture. Electronically Signed   By: Beckie Salts M.D.   On: 03/09/2018 20:55    ____________________________________________    PROCEDURES  Procedure(s) performed:    Procedures    Medications  meloxicam (MOBIC) tablet 15 mg (has no administration in time range)     ____________________________________________   INITIAL IMPRESSION / ASSESSMENT AND PLAN / ED COURSE  Pertinent labs & imaging results that were available during my care of the patient were reviewed by me and considered in my medical decision making (see chart for details).  Review of the  [place] CSRS was performed in accordance of the [facility] prior to dispensing any controlled drugs.      Patient's diagnosis is consistent with contusion of the right ankle.  Patient presents the emergency department with ankle pain after dropping her phone on her ankle.  X-ray was unremarkable.  Exam is unremarkable.  Diagnosis is most likely contusion of the ankle.  Patient will be prescribed meloxicam.  Follow-up with primary care as needed. Patient is given ED precautions to return to the ED for any worsening or new symptoms.     ____________________________________________  FINAL CLINICAL IMPRESSION(S) / ED DIAGNOSES  Final diagnoses:  Contusion of left ankle, initial encounter      NEW MEDICATIONS STARTED DURING THIS VISIT:  ED Discharge Orders         Ordered    meloxicam (MOBIC) 15 MG tablet  Daily     03/09/18 2137              This chart was dictated using voice recognition software/Dragon. Despite best efforts  to proofread, errors can occur which can change the meaning. Any change was purely unintentional.    Racheal PatchesCuthriell, Jonathan D, PA-C 03/09/18 2139    Rockne MenghiniNorman, Anne-Caroline, MD 03/10/18 [phone]

## 2020-04-11 ENCOUNTER — Encounter: Payer: Self-pay | Admitting: Emergency Medicine

## 2020-04-11 ENCOUNTER — Other Ambulatory Visit: Payer: Self-pay

## 2020-04-11 ENCOUNTER — Emergency Department: Payer: Medicaid Other

## 2020-04-11 DIAGNOSIS — R0789 Other chest pain: Secondary | ICD-10-CM | POA: Diagnosis not present

## 2020-04-11 DIAGNOSIS — F17210 Nicotine dependence, cigarettes, uncomplicated: Secondary | ICD-10-CM | POA: Diagnosis not present

## 2020-04-11 LAB — BASIC METABOLIC PANEL
Anion gap: 9 (ref 5–15)
BUN: 9 mg/dL (ref 6–20)
CO2: 25 mmol/L (ref 22–32)
Calcium: 9.2 mg/dL (ref 8.9–10.3)
Chloride: 103 mmol/L (ref 98–111)
Creatinine, Ser: 0.7 mg/dL (ref 0.44–1.00)
GFR, Estimated: >60 mL/min (ref >60)
Glucose, Bld: 102 mg/dL — ABNORMAL HIGH (ref 70–99)
Potassium: 3.6 mmol/L (ref 3.5–5.1)
Sodium: 137 mmol/L (ref 135–145)

## 2020-04-11 LAB — CBC
HCT: 42.2 % (ref 36.0–46.0)
Hemoglobin: 14 g/dL (ref 12.0–15.0)
MCH: 30 pg (ref 26.0–34.0)
MCHC: 33.2 g/dL (ref 30.0–36.0)
MCV: 90.6 fL (ref 80.0–100.0)
Platelets: 254 10*3/uL (ref 150–400)
RBC: 4.66 MIL/uL (ref 3.87–5.11)
RDW: 12.5 % (ref 11.5–15.5)
WBC: 6.3 10*3/uL (ref 4.0–10.5)
nRBC: 0 % (ref 0.0–0.2)

## 2020-04-11 LAB — TROPONIN I (HIGH SENSITIVITY): Troponin I (High Sensitivity): <2 ng/L (ref <18)

## 2020-04-11 NOTE — ED Triage Notes (Addendum)
Pt to ED via EMS from home c/o mid chest pain radiating to left shoulder and arm, nausea without vomiting.  Pain is tight and sharp and intermittently more severe.  Hx of anxiety but states this feels different.  Pt A&Ox4, skin WNL, chest rise even and unlabored, in NAD at this time.  Given 324 ASA en route by EMS.

## 2020-04-12 ENCOUNTER — Emergency Department
Admission: EM | Admit: 2020-04-12 | Discharge: 2020-04-12 | Disposition: A | Discharge status: Home / Self Care | Payer: Medicaid Other | Attending: Emergency Medicine | Admitting: Emergency Medicine

## 2020-04-12 DIAGNOSIS — R079 Chest pain, unspecified: Secondary | ICD-10-CM

## 2020-04-12 HISTORY — DX: Anxiety disorder, unspecified: F41.9

## 2020-04-12 LAB — TROPONIN I (HIGH SENSITIVITY): Troponin I (High Sensitivity): <2 ng/L (ref <18)

## 2020-04-12 NOTE — Discharge Instructions (Signed)
Your cardiac work-up today was very reassuring.  I suspect your symptoms are due to anxiety.  If you have recurring symptoms, I recommend close follow-up with your primary care doctor.

## 2020-04-12 NOTE — ED Provider Notes (Signed)
[facility] Emergency Department Provider Note   ____________________________________________   Event Date/Time   First MD Initiated Contact with Patient 04/12/20 [phone]     (approximate)  I have reviewed the triage vital signs and the nursing notes.   HISTORY  Chief Complaint Chest Pain    HPI Karen Duarte is a 35 y.o. female with history of panic attacks, tobacco use who presents to the emergency department with left-sided chest pain that she describes as sharp, severe in nature and also like a "clenching" pain that started yesterday while at rest.  No radiation of pain.  States it came on suddenly.  States that she has had similar symptoms when she has had a panic attack but what concerned her was that this pain would come and go.  She states there was no aggravating factors but symptoms did improve when she would lay down and try to relax.  No shortness of breath, nausea or vomiting.  She did feel lightheaded like she might pass out and had clammy hands.  No fevers or cough.  Does report for the past year she has had intermittent swelling of both of her legs but no calf tenderness or calf swelling currently.  No history of PE, DVT, exogenous estrogen use, recent fractures, surgery, trauma, hospitalization, prolonged travel or other immobilization.  She did have outpatient procedure for lipoma removal recently.  She does have a Mirena IUD.  Symptoms have now completely resolved.     Past Medical History:  Diagnosis Date  . Anxiety   . GERD (gastroesophageal reflux disease)    with pregnancy    Patient Active Problem List   Diagnosis Date Noted  . Normal labor 06/16/2016    Past Surgical History:  Procedure Laterality Date  . DILATION AND CURETTAGE OF UTERUS     Therapuetic abortion  . HAND SURGERY    . WISDOM TOOTH EXTRACTION      Prior to Admission medications   Medication Sig Start Date End Date Taking? Authorizing Provider   acetaminophen (TYLENOL) 325 MG tablet Take 650 mg by mouth every 6 (six) hours as needed for mild pain, fever or headache.    [provider]  gabapentin (NEURONTIN) 300 MG capsule Take 300 mg by mouth 3 (three) times daily.    [provider]  HYDROcodone-acetaminophen (NORCO/VICODIN) 5-325 MG tablet Take 1 tablet by mouth every 6 (six) hours as needed for moderate pain. 11/28/17   Tommi Rumps, PA-C  ibuprofen (ADVIL,MOTRIN) 800 MG tablet Take 800 mg by mouth every 8 (eight) hours as needed.    [provider]  levonorgestrel (MIRENA) 20 MCG/24HR IUD 1 Intra Uterine Device (1 each total) by Intrauterine route once. Patient not taking: Reported on 11/22/2016 09/03/16 09/03/16  Conard Novak, MD  meloxicam (MOBIC) 15 MG tablet Take 1 tablet (15 mg total) by mouth daily. 03/09/18   Cuthriell, Delorise Royals, PA-C    Allergies Latex  Family History  Problem Relation Age of Onset  . Diabetes Mellitus I Father   . Diabetes Mellitus II Paternal Grandmother   . Hypertension Paternal Grandmother     Social History Social History   Tobacco Use  . Smoking status: Current Every Day Smoker    Packs/day: 0.25    Years: 12.00    Pack years: 3.00    Types: Cigarettes    Last attempt to quit: 05/04/2016    Years since quitting: 3.9  . Smokeless tobacco: Former Neurosurgeon  Quit date: 09/02/2014  . Tobacco comment: last used 1 week ago  Vaping Use  . Vaping Use: Never used  Substance Use Topics  . Alcohol use: No  . Drug use: No    Review of Systems  Constitutional: No fever/chills Eyes: No visual changes. ENT: No sore throat. Cardiovascular: + chest pain. Respiratory: Denies shortness of breath. Gastrointestinal: No abdominal pain.  No nausea, no vomiting.  No diarrhea.  No constipation. Genitourinary: Negative for dysuria. Musculoskeletal: Negative for back pain. Skin: Negative for rash. Neurological: Negative for headaches, focal weakness or  numbness.   ____________________________________________   PHYSICAL EXAM:  VITAL SIGNS: ED Triage Vitals  Enc Vitals Group     BP 04/11/20 2105 107/73     Pulse Rate 04/11/20 2105 96     Resp 04/11/20 2105 16     Temp 04/11/20 2105 98.2 F (36.8 C)     Temp Source 04/11/20 2105 Oral     SpO2 04/11/20 2105 100 %     Weight 04/11/20 2106 143 lb (64.9 kg)     Height 04/11/20 2106 5\' 2"  (1.575 m)     Head Circumference --      Peak Flow --      Pain Score 04/11/20 2106 8     Pain Loc --      Pain Edu? --      Excl. in GC? --     Constitutional: Alert and oriented. Well appearing and in no acute distress. Eyes: Conjunctivae are normal. PERRL. EOMI. Head: Atraumatic. Nose: No congestion/rhinnorhea. Mouth/Throat: Mucous membranes are moist.  Oropharynx non-erythematous. Neck: No stridor.   Cardiovascular: Normal rate, regular rhythm. Grossly normal heart sounds.  Good peripheral circulation. Respiratory: Normal respiratory effort.  No retractions. Lungs CTAB. Gastrointestinal: Soft and nontender. No distention. No abdominal bruits. No CVA tenderness. Musculoskeletal: No lower extremity tenderness nor edema.  No joint effusions.  No calf pain or calf tenderness. Neurologic:  Normal speech and language. No gross focal neurologic deficits are appreciated. No gait instability. Skin:  Skin is warm, dry and intact. No rash noted. Psychiatric: Mood and affect are normal. Speech and behavior are normal.  ____________________________________________   LABS (all labs ordered are listed, but only abnormal results are displayed)  Labs Reviewed  BASIC METABOLIC PANEL - Abnormal; Notable for the following components:      Result Value   Glucose, Bld 102 (*)    All other components within normal limits  CBC  POC URINE PREG, ED  TROPONIN I (HIGH SENSITIVITY)  TROPONIN I (HIGH SENSITIVITY)   ____________________________________________  EKG   EKG  Interpretation  Date/Time:  Tuesday April 11 2020 21:00:03 EST Ventricular Rate:  103 PR Interval:  144 QRS Duration: 82 QT Interval:  368 QTC Calculation: 482 R Axis:   75 Text Interpretation: Sinus tachycardia Otherwise normal ECG No significant change since last tracing Confirmed by 01[birth year]27[birth year]1980 [phone]) on 04/12/2020 1:08:37 AM       ____________________________________________  RADIOLOGY  ED MD interpretation: No infiltrate or edema.  No pneumothorax.  Official radiology report(s): DG Chest 2 View  Result Date: 04/11/2020 CLINICAL DATA:  35 year old female with chest pain. EXAM: CHEST - 2 VIEW COMPARISON:  Chest radiograph dated 11/28/2017. FINDINGS: The heart size and mediastinal contours are within normal limits. Both lungs are clear. The visualized skeletal structures are unremarkable. IMPRESSION: No active cardiopulmonary disease. Electronically Signed   By: 11/30/2017 M.D.   On: 04/11/2020 21:25    ____________________________________________  PROCEDURES  Procedure(s) performed: None  Procedures  Critical Care performed: No  ____________________________________________   INITIAL IMPRESSION / ASSESSMENT AND PLAN / ED COURSE  As part of my medical decision making, I reviewed the following data within the electronic MEDICAL RECORD NUMBER Nursing notes reviewed and incorporated, Labs reviewed without signs of acute abnormality and 2 negative troponins, EKG interpreted tachycardia, no significant changes from prior EKG, Old EKG reviewed, Radiograph reviewed without acute abnormality, Notes from prior ED visits and [place] Controlled Substance Database    Patient here with atypical chest pain.  Symptoms now completely resolved.  Suspect panic attack.  Labs here are reassuring she has had 2 normal high-sensitivity troponins.  Chest x-ray clear without edema, infiltrate or pneumothorax.  EKG shows mild sinus tachycardia without other acute abnormality.  No ischemia.  Her  heart rate is now in the 80s.  She has no risk factors for PE and given she is now asymptomatic, I have a very low suspicion that she has a pulmonary embolus.  She does have a Mirena IUD but no exogenous estrogen use.  Doubt dissection.  I feel she is safe for discharge home with close follow-up with her primary care physician if symptoms return.  She is comfortable with this plan.  At this time, I do not feel there is any life-threatening condition present. I have reviewed, interpreted and discussed all results (EKG, imaging, lab, urine as appropriate) and exam findings with patient/family. I have reviewed nursing notes and appropriate previous records.  I feel the patient is safe to be discharged home without further emergent workup and can continue workup as an outpatient as needed. Discussed usual and customary return precautions. Patient/family verbalize understanding and are comfortable with this plan.  Outpatient follow-up has been provided as needed. All questions have been answered.       ____________________________________________   FINAL CLINICAL IMPRESSION(S) / ED DIAGNOSES  Final diagnoses:  Nonspecific chest pain     ED Discharge Orders    None       Note:  This document was prepared using Dragon voice recognition software and may include unintentional dictation errors.    Azavier Creson, Layla Maw, DO 04/12/20 [phone]

## 2020-04-12 NOTE — ED Notes (Signed)
Ward, MD at bedside. 

## 2021-09-10 IMAGING — CR DG CHEST 2V
2 series · 2 of 2 positions shown · non-contrast
Comparison: Chest radiograph dated 11/28/2017.

CLINICAL DATA: 34-year-old female with chest pain.

EXAM:
CHEST - 2 VIEW

[chest pa]
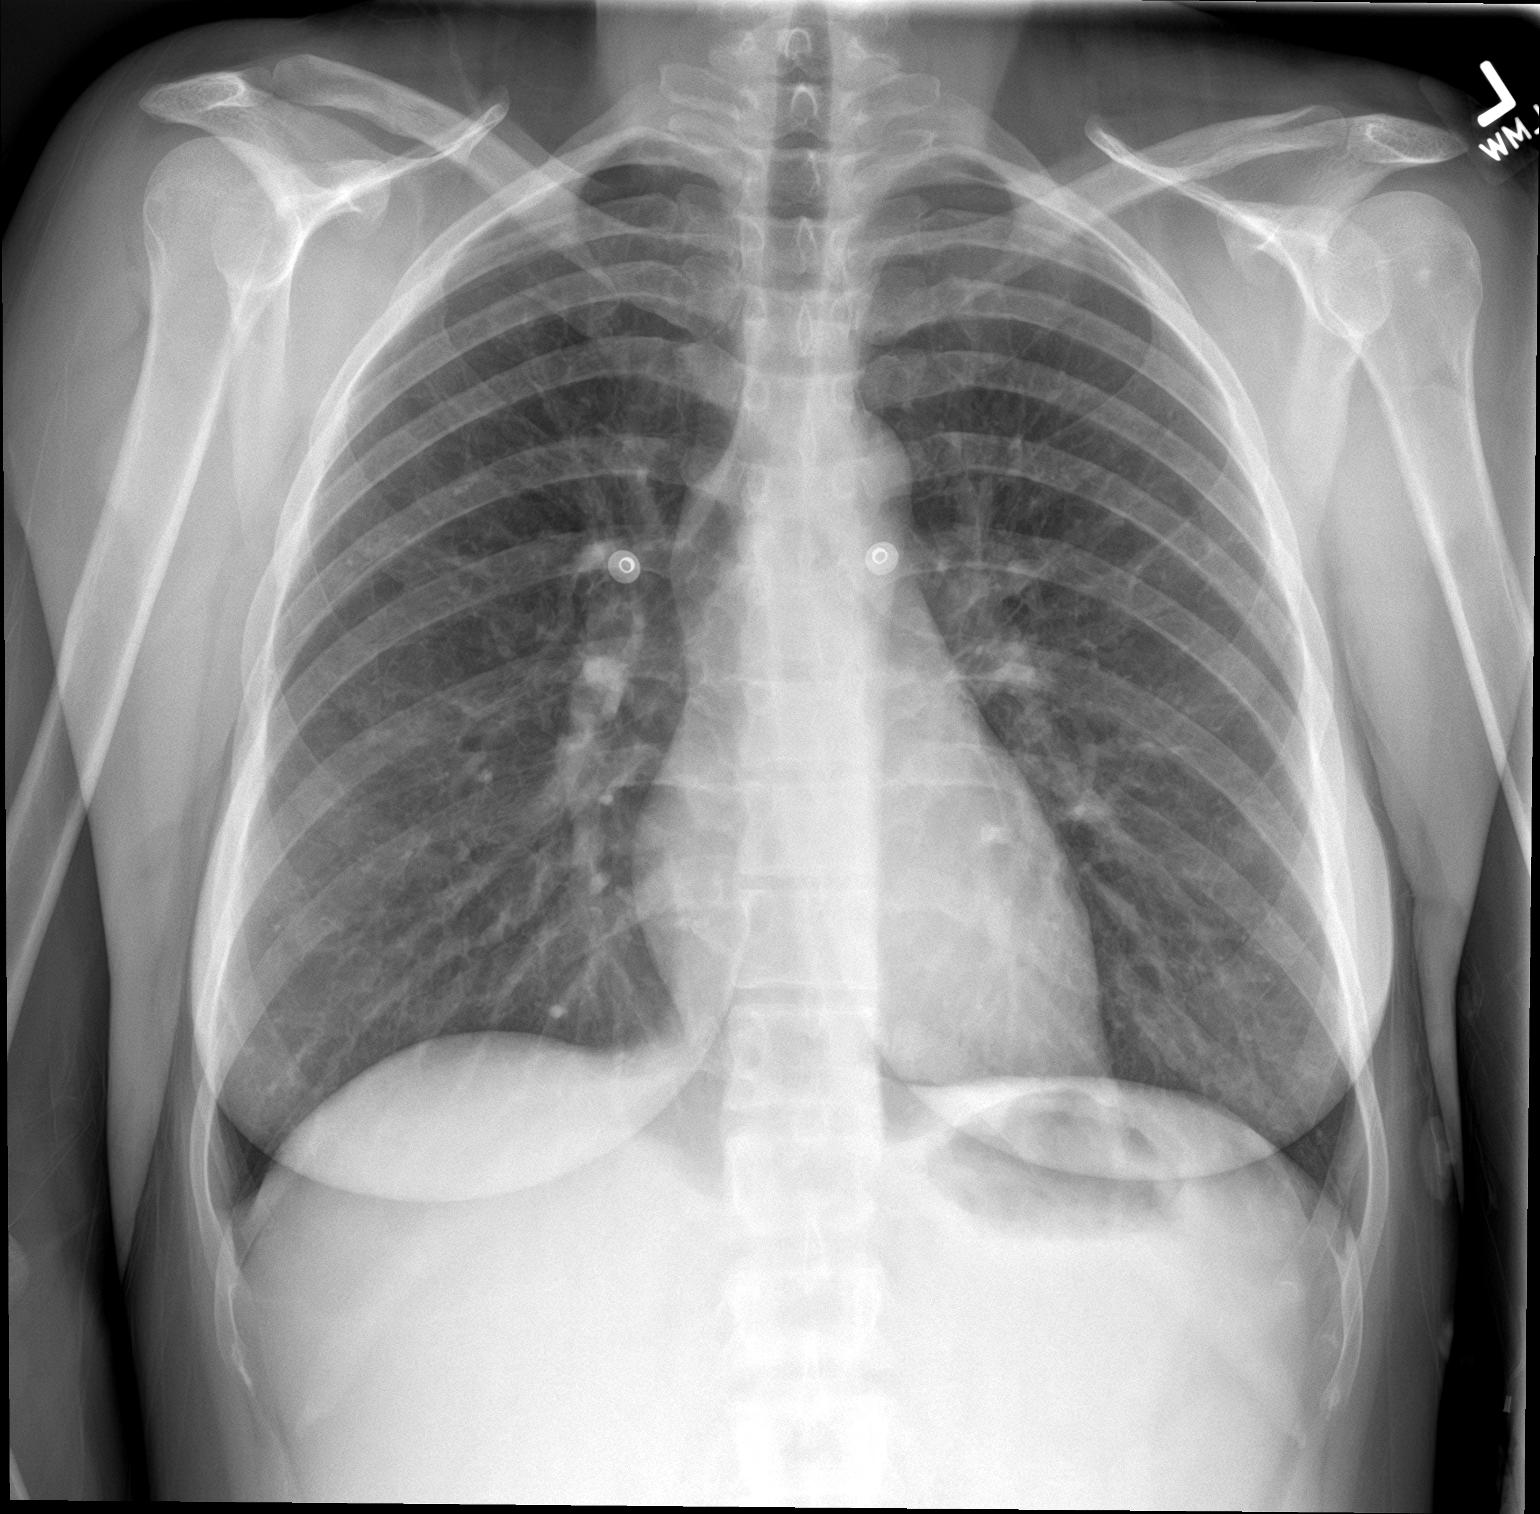

[chest lat]
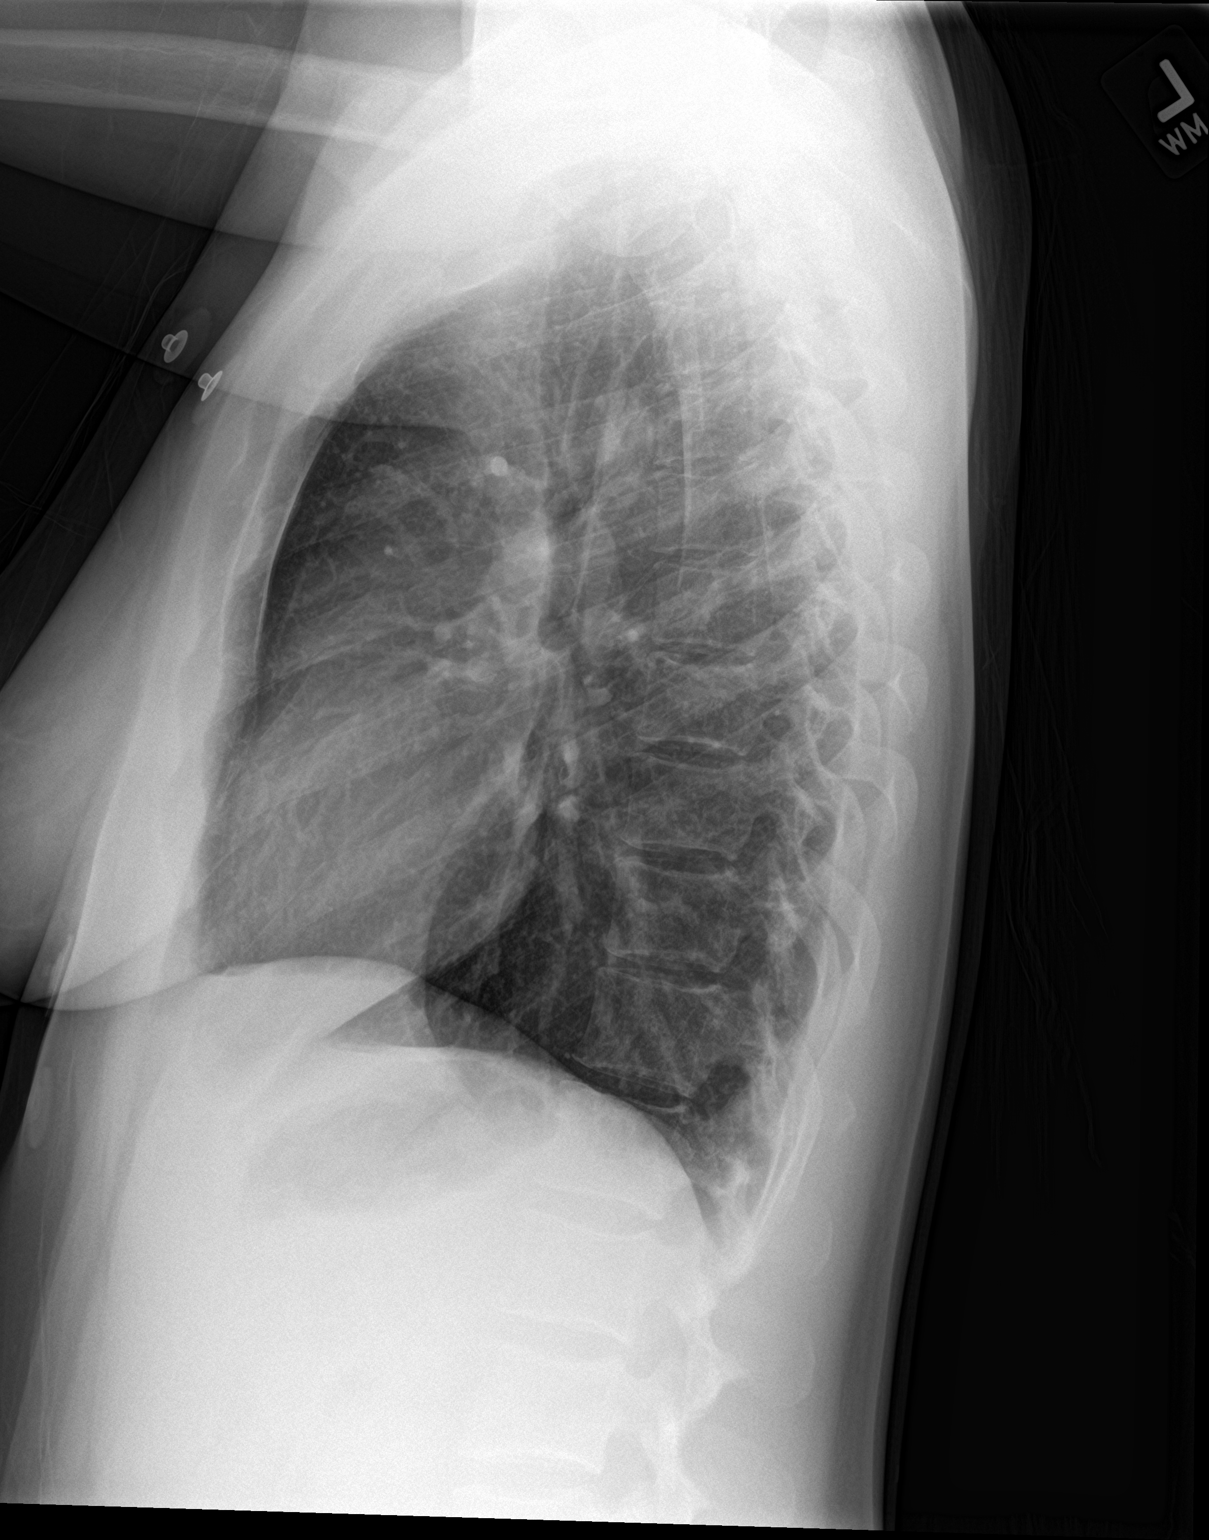

[2 of 2 positions shown; findings below may reference images not displayed]

FINDINGS: The heart size and mediastinal contours are within normal limits.
Both lungs are clear. The visualized skeletal structures are
unremarkable.
IMPRESSION: No active cardiopulmonary disease.

## 2021-11-05 ENCOUNTER — Encounter (HOSPITAL_COMMUNITY): Payer: Self-pay

## 2021-11-05 ENCOUNTER — Emergency Department (HOSPITAL_COMMUNITY)
Admission: EM | Admit: 2021-11-05 | Discharge: 2021-11-05 | Disposition: A | Discharge status: Home / Self Care | Payer: Self-pay | Attending: Emergency Medicine | Admitting: Emergency Medicine

## 2021-11-05 ENCOUNTER — Emergency Department (HOSPITAL_COMMUNITY): Payer: Self-pay

## 2021-11-05 ENCOUNTER — Other Ambulatory Visit: Payer: Self-pay

## 2021-11-05 DIAGNOSIS — Z87891 Personal history of nicotine dependence: Secondary | ICD-10-CM | POA: Insufficient documentation

## 2021-11-05 DIAGNOSIS — N6321 Unspecified lump in the left breast, upper outer quadrant: Secondary | ICD-10-CM

## 2021-11-05 DIAGNOSIS — R109 Unspecified abdominal pain: Secondary | ICD-10-CM

## 2021-11-05 DIAGNOSIS — R10A1 Flank pain, right side: Secondary | ICD-10-CM

## 2021-11-05 DIAGNOSIS — Z853 Personal history of malignant neoplasm of breast: Secondary | ICD-10-CM | POA: Insufficient documentation

## 2021-11-05 DIAGNOSIS — N12 Tubulo-interstitial nephritis, not specified as acute or chronic: Secondary | ICD-10-CM

## 2021-11-05 LAB — BASIC METABOLIC PANEL
Anion gap: 5 (ref 5–15)
BUN: 12 mg/dL (ref 6–20)
CO2: 26 mmol/L (ref 22–32)
Calcium: 9 mg/dL (ref 8.9–10.3)
Chloride: 110 mmol/L (ref 98–111)
Creatinine, Ser: 0.86 mg/dL (ref 0.44–1.00)
GFR, Estimated: >60 mL/min (ref >60)
Glucose, Bld: 104 mg/dL — ABNORMAL HIGH (ref 70–99)
Potassium: 3.9 mmol/L (ref 3.5–5.1)
Sodium: 141 mmol/L (ref 135–145)

## 2021-11-05 LAB — URINALYSIS, ROUTINE W REFLEX MICROSCOPIC
Bilirubin Urine: NEGATIVE
Glucose, UA: NEGATIVE mg/dL
Ketones, ur: NEGATIVE mg/dL
Nitrite: NEGATIVE
Protein, ur: 100 mg/dL — AB
RBC / HPF: >50 RBC/hpf — ABNORMAL HIGH (ref 0–5)
Specific Gravity, Urine: 1.015 (ref 1.005–1.030)
WBC, UA: >50 WBC/hpf — ABNORMAL HIGH (ref 0–5)
pH: 7 (ref 5.0–8.0)

## 2021-11-05 LAB — CBC
HCT: 38 % (ref 36.0–46.0)
Hemoglobin: 12.6 g/dL (ref 12.0–15.0)
MCH: 29.2 pg (ref 26.0–34.0)
MCHC: 33.2 g/dL (ref 30.0–36.0)
MCV: 88.2 fL (ref 80.0–100.0)
Platelets: 255 10*3/uL (ref 150–400)
RBC: 4.31 MIL/uL (ref 3.87–5.11)
RDW: 12.3 % (ref 11.5–15.5)
WBC: 10.6 10*3/uL — ABNORMAL HIGH (ref 4.0–10.5)
nRBC: 0 % (ref 0.0–0.2)

## 2021-11-05 LAB — I-STAT BETA HCG BLOOD, ED (MC, WL, AP ONLY): I-stat hCG, quantitative: <5 m[IU]/mL (ref <5)

## 2021-11-05 MED ORDER — SODIUM CHLORIDE 0.9 % IV BOLUS
1000.0000 mL | Freq: Once | INTRAVENOUS | Status: AC
  Administered 2021-11-05: 1000 mL via INTRAVENOUS

## 2021-11-05 MED ORDER — CEPHALEXIN 500 MG PO CAPS: 500.0000 mg | ORAL_CAPSULE | Freq: Four times a day (QID) | ORAL | 0 refills | Status: AC

## 2021-11-05 MED ORDER — HYDROMORPHONE HCL 1 MG/ML IJ SOLN
0.5000 mg | Freq: Once | INTRAMUSCULAR | Status: AC
  Administered 2021-11-05: 0.5 mg via INTRAVENOUS
  Filled 2021-11-05: qty 1

## 2021-11-05 MED ORDER — ONDANSETRON HCL 4 MG/2ML IJ SOLN
4.0000 mg | Freq: Once | INTRAMUSCULAR | Status: AC
  Administered 2021-11-05: 4 mg via INTRAVENOUS
  Filled 2021-11-05: qty 2

## 2021-11-05 MED ORDER — ONDANSETRON 4 MG PO TBDP: 4.0000 mg | ORAL_TABLET | Freq: Three times a day (TID) | ORAL | 1 refills | Status: AC | PRN

## 2021-11-05 MED ORDER — SODIUM CHLORIDE 0.9 % IV SOLN: INTRAVENOUS | Status: DC

## 2021-11-05 MED ORDER — HYDROMORPHONE HCL 1 MG/ML IJ SOLN
1.0000 mg | Freq: Once | INTRAMUSCULAR | Status: AC
  Administered 2021-11-05: 1 mg via INTRAVENOUS
  Filled 2021-11-05: qty 1

## 2021-11-05 MED ORDER — KETOROLAC TROMETHAMINE 30 MG/ML IJ SOLN
30.0000 mg | Freq: Once | INTRAMUSCULAR | Status: AC
  Administered 2021-11-05: 30 mg via INTRAVENOUS
  Filled 2021-11-05: qty 1

## 2021-11-05 MED ORDER — SODIUM CHLORIDE 0.9 % IV SOLN
1.0000 g | Freq: Once | INTRAVENOUS | Status: AC
  Administered 2021-11-05: 1 g via INTRAVENOUS
  Filled 2021-11-05: qty 10

## 2021-11-05 MED ORDER — OXYCODONE-ACETAMINOPHEN 5-325 MG PO TABS: 1.0000 | ORAL_TABLET | Freq: Four times a day (QID) | ORAL | 0 refills | Status: AC | PRN

## 2021-11-05 NOTE — ED Triage Notes (Signed)
Reports right flank pain that radiates to right lower abd.  Difficulty with urination, burning.  +nausea.

## 2021-11-05 NOTE — ED Provider Notes (Addendum)
[facility] EMERGENCY DEPARTMENT Provider Note   CSN: [id number] Arrival date & time: 11/05/21  0636     History  Chief Complaint  Patient presents with   Flank Pain    Karen Duarte is a 36 y.o. female.  Patient with acute onset of right flank pain associated with nausea difficulty with urination and burning.  Everything started yesterday at 1800.  Patient has had kidney stones in the past about 2-3 episodes and this does remind her of a kidney stone.  Patient also has a history of frequent urinary tract infections but has not been on antibiotics since March.  Past medical history significant for the history of kidney stones and gastroesophageal reflux disease.  Patient is a former smoker quit in 2018.       Home Medications Prior to Admission medications   Medication Sig Start Date End Date Taking? Authorizing Provider  acetaminophen (TYLENOL) 325 MG tablet Take 975 mg by mouth daily as needed for headache.   Yes [provider]  aspirin-acetaminophen-caffeine (EXCEDRIN MIGRAINE) [id number] MG tablet Take 2 tablets by mouth daily as needed for headache.   Yes [provider]  cephALEXin (KEFLEX) 500 MG capsule Take 1 capsule (500 mg total) by mouth 4 (four) times daily. 11/05/21  Yes Vanetta Mulders, MD  gabapentin (NEURONTIN) 300 MG capsule Take 300 mg by mouth 2 (two) times daily.   Yes [provider]  ibuprofen (ADVIL) 200 MG tablet Take 600-800 mg by mouth daily as needed for headache.   Yes [provider]  ondansetron (ZOFRAN-ODT) 4 MG disintegrating tablet Take 1 tablet (4 mg total) by mouth every 8 (eight) hours as needed for nausea or vomiting. 11/05/21  Yes Vanetta Mulders, MD  oxyCODONE-acetaminophen (PERCOCET/ROXICET) 5-325 MG tablet Take 1 tablet by mouth every 6 (six) hours as needed for severe pain. 11/05/21  Yes Vanetta Mulders, MD      Allergies    Latex    Review of Systems   Review of Systems   Constitutional:  Negative for chills and fever.  HENT:  Negative for ear pain and sore throat.   Eyes:  Negative for pain and visual disturbance.  Respiratory:  Negative for cough and shortness of breath.   Cardiovascular:  Negative for chest pain and palpitations.  Gastrointestinal:  Positive for abdominal pain and nausea. Negative for vomiting.  Genitourinary:  Positive for dysuria and flank pain. Negative for hematuria.  Musculoskeletal:  Negative for arthralgias and back pain.  Skin:  Negative for color change and rash.  Neurological:  Negative for seizures and syncope.  All other systems reviewed and are negative.   Physical Exam Updated Vital Signs BP 99/63   Pulse 90   Temp 98.6 F (37 C) (Oral)   Resp 20   Ht 1.6 m (5\' 3" )   Wt 56.7 kg   SpO2 98%   BMI 22.14 kg/m  Physical Exam Vitals and nursing note reviewed.  Constitutional:      General: She is not in acute distress.    Appearance: Normal appearance. She is well-developed. She is not diaphoretic.  HENT:     Head: Normocephalic and atraumatic.  Eyes:     Conjunctiva/sclera: Conjunctivae normal.     Pupils: Pupils are equal, round, and reactive to light.  Cardiovascular:     Rate and Rhythm: Normal rate and regular rhythm.     Heart sounds: No murmur heard. Pulmonary:     Effort: Pulmonary effort is normal. No  respiratory distress.     Breath sounds: Normal breath sounds.  Abdominal:     Palpations: Abdomen is soft.     Tenderness: There is no abdominal tenderness.  Musculoskeletal:        General: No swelling.     Cervical back: Normal range of motion and neck supple.  Skin:    General: Skin is warm and dry.     Capillary Refill: Capillary refill takes less than 2 seconds.  Neurological:     General: No focal deficit present.     Mental Status: She is alert and oriented to person, place, and time.  Psychiatric:        Mood and Affect: Mood normal.     ED Results / Procedures / Treatments    Labs (all labs ordered are listed, but only abnormal results are displayed) Labs Reviewed  URINALYSIS, ROUTINE W REFLEX MICROSCOPIC - Abnormal; Notable for the following components:      Result Value   APPearance CLOUDY (*)    Hgb urine dipstick MODERATE (*)    Protein, ur 100 (*)    Leukocytes,Ua LARGE (*)    RBC / HPF >50 (*)    WBC, UA >50 (*)    Bacteria, UA MANY (*)    Non Squamous Epithelial 0-5 (*)    All other components within normal limits  BASIC METABOLIC PANEL - Abnormal; Notable for the following components:   Glucose, Bld 104 (*)    All other components within normal limits  CBC - Abnormal; Notable for the following components:   WBC 10.6 (*)    All other components within normal limits  URINE CULTURE  I-STAT BETA HCG BLOOD, ED ([facility], [facility], [facility] ONLY)    EKG None  Radiology CT Renal Stone Study  Result Date: 11/05/2021 CLINICAL DATA:  Right-sided flank pain.  Kidney stones suspected. EXAM: CT ABDOMEN AND PELVIS WITHOUT CONTRAST TECHNIQUE: Multidetector CT imaging of the abdomen and pelvis was performed following the standard protocol without IV contrast. RADIATION DOSE REDUCTION: This exam was performed according to the departmental dose-optimization program which includes automated exposure control, adjustment of the mA and/or kV according to patient size and/or use of iterative reconstruction technique. COMPARISON:  11/22/2016 FINDINGS: Lower chest: Unremarkable. Hepatobiliary: No suspicious focal abnormality in the liver on this study without intravenous contrast. There is no evidence for gallstones, gallbladder wall thickening, or pericholecystic fluid. No intrahepatic or extrahepatic biliary dilation. Pancreas: No focal mass lesion. No dilatation of the main duct. No intraparenchymal cyst. No peripancreatic edema. Spleen: No splenomegaly. No focal mass lesion. Adrenals/Urinary Tract: No adrenal nodule or mass. Left kidney and ureter unremarkable. Subtle peripelvic and  proximal periureteric edema noted in the region of the right kidney and proximal ureter (see image 34 of series 3). No substantial right hydroureter and no stone can be identified in the right ureter. No bladder stones. Stomach/Bowel: Stomach is unremarkable. No gastric wall thickening. No evidence of outlet obstruction. Duodenum is normally positioned as is the ligament of Treitz. No small bowel wall thickening. No small bowel dilatation. The terminal ileum is normal. The appendix is normal. No gross colonic mass. No colonic wall thickening. Vascular/Lymphatic: No abdominal aortic aneurysm. There is no gastrohepatic or hepatoduodenal ligament lymphadenopathy. No retroperitoneal or mesenteric lymphadenopathy. No pelvic sidewall lymphadenopathy. Reproductive: Unremarkable.  No adnexal mass. Other: No intraperitoneal free fluid. Musculoskeletal: No worrisome lytic or sclerotic osseous abnormality. IMPRESSION: 1. Subtle peripelvic and proximal periureteric edema in the region of the right kidney  and proximal ureter. No substantial right hydroureter and no stone can be identified in the right kidney, right ureter or bladder lumen. Imaging features could be related to a recently passed stone or infection. Given patient age, neoplasm is considered unlikely. 2. Otherwise unremarkable exam. Electronically Signed   By: Kennith Center M.D.   On: 11/05/2021 11:36    Procedures Procedures    Medications Ordered in ED Medications  0.9 %  sodium chloride infusion ( Intravenous Stopped 11/05/21 1113)  sodium chloride 0.9 % bolus 1,000 mL (0 mLs Intravenous Stopped 11/05/21 1056)  ondansetron (ZOFRAN) injection 4 mg (4 mg Intravenous Given 11/05/21 0937)  HYDROmorphone (DILAUDID) injection 0.5 mg (0.5 mg Intravenous Given 11/05/21 0938)  HYDROmorphone (DILAUDID) injection 1 mg (1 mg Intravenous Given 11/05/21 1019)  cefTRIAXone (ROCEPHIN) 1 g in sodium chloride 0.9 % 100 mL IVPB (0 g Intravenous Stopped 11/05/21 1101)   HYDROmorphone (DILAUDID) injection 1 mg (1 mg Intravenous Given 11/05/21 1253)  ketorolac (TORADOL) 30 MG/ML injection 30 mg (30 mg Intravenous Given 11/05/21 1253)    ED Course/ Medical Decision Making/ A&P                           Medical Decision Making Amount and/or Complexity of Data Reviewed Labs: ordered. Radiology: ordered.  Risk Prescription drug management.   Patient most likely with right-sided kidney stone based on her past history and the way it feels.  We will get CT renal to rule that in.  We will give IV fluids pain medicine antinausea medicine.  Urinalysis significant for what appears to be urinary tract infection.  Increased blood and white blood cell count in the urine.  However this could be secondary just to the stone itself.  Urine sent for culture and will give 1 g of Rocephin while waiting for CT renal study.  Patient had an i-STAT hCG ordered but it was never done so it is delayed getting the CT scan.  Pregnancy test negative.  Awaiting CT renal results.  CT renal showed no evidence of any stone.  We could be consistent with a recently passed stone or infection.  I am leaning towards infection based on the urinalysis.  Patient given 1 g of Rocephin here.  Patient will be continued on Keflex.  Had difficulty controlling her pain the Toradol may have helped her pain significantly meaning that maybe she had some ureteral spasm.  We will have patient take Motrin or Aleve at home.  Will Percocet for additional pain Zofran for any nausea and vomiting.  Patient then raised concerns about a lump in her left breast that is been present for about a year.  Its left upper outer quadrant.  Some mild tenderness with it no erythema.  Similar feeling to tissue in the right upper quadrant breast area as well.  But this is a little more pronounced.  Patient is followed by OB/GYN at [facility] so she will make an appointment to follow-up with them for further work-up.  Patient knows that  is important that she gets this worked up.  She does have a family history of breast cancer.    Final Clinical Impression(s) / ED Diagnoses Final diagnoses:  Right flank pain  Pyelonephritis  Mass of upper outer quadrant of left breast    Rx / DC Orders ED Discharge Orders          Ordered    oxyCODONE-acetaminophen (PERCOCET/ROXICET) 5-325 MG tablet  Every 6  hours PRN        11/05/21 1425    cephALEXin (KEFLEX) 500 MG capsule  4 times daily        11/05/21 1425    ondansetron (ZOFRAN-ODT) 4 MG disintegrating tablet  Every 8 hours PRN        11/05/21 1425              Vanetta Mulders, MD 11/05/21 1026    Vanetta Mulders, MD 11/05/21 1054    Vanetta Mulders, MD 11/05/21 1427

## 2021-11-05 NOTE — ED Notes (Signed)
Pt teaching provided on medications that may cause drowsiness. Pt instructed not to drive or operate heavy machinery while taking the prescribed medication. Pt verbalized understanding.   Pt provided discharge instructions and prescription information. Pt was given the opportunity to ask questions and questions were answered.   

## 2021-11-05 NOTE — Discharge Instructions (Signed)
Take the antibiotic Keflex as directed.  Take the Zofran for nausea and vomiting.  Take the Percocet as needed for pain.  Also feel that you have benefited significantly from medicines like Motrin so would recommend taking Motrin 800 mg every 8 hours Motrin is also known as ibuprofen.  Also could take over-the-counter Aleve and you could take 2 tablets every 12 hours.  Return for any new or worse symptoms or if not improving.  Regarding the breast lump on the left side recommend follow-up with your OB/GYN at [facility].

## 2021-11-07 LAB — URINE CULTURE: Culture: >=100000 — AB

## 2021-11-08 ENCOUNTER — Telehealth: Payer: Self-pay

## 2021-11-08 NOTE — Telephone Encounter (Signed)
Post ED Visit - Positive Culture Follow-up  Culture report reviewed by antimicrobial stewardship pharmacist: [facility] [x]  , Pharm.D. []  Vernetta Honey, Pharm.D., BCPS AQ-ID []  , Pharm.D., BCPS []  Celedonio Miyamoto, .D., BCPS []  [place], .D., BCPS, AAHIVP []  Georgina Pillion, Pharm.D., BCPS, AAHIVP []  [address], PharmD, BCPS []  , PharmD, BCPS []  [place], PharmD, BCPS []  [place], PharmD []  , PharmD, BCPS []  Estella Husk, PharmD  Pharmacy Team []  Lysle Pearl, PharmD []  , PharmD []  Phillips Climes, PharmD []  , Rph []  Agapito Games) , PharmD []  Verlan Friends, PharmD []  , PharmD []  Mervyn Gay, PharmD []  , PharmD []  Vinnie Level, PharmD []  [facility], PharmD []  , PharmD []  Len Childs, PharmD   Positive urine culture Treated with Cephalexin, organism sensitive to the same and no further patient follow-up is required at this time.  11/08/2021, 11:37 AM

## 2024-01-10 ENCOUNTER — Emergency Department (HOSPITAL_COMMUNITY)

## 2024-01-10 ENCOUNTER — Emergency Department (HOSPITAL_COMMUNITY)
Admission: EM | Admit: 2024-01-10 | Discharge: 2024-01-10 | Disposition: A | Discharge status: Home / Self Care | Attending: Emergency Medicine | Admitting: Emergency Medicine

## 2024-01-10 ENCOUNTER — Encounter (HOSPITAL_COMMUNITY): Payer: Self-pay

## 2024-01-10 ENCOUNTER — Other Ambulatory Visit: Payer: Self-pay

## 2024-01-10 DIAGNOSIS — R1013 Epigastric pain: Secondary | ICD-10-CM | POA: Diagnosis present

## 2024-01-10 DIAGNOSIS — Z91040 Latex allergy status: Secondary | ICD-10-CM | POA: Diagnosis not present

## 2024-01-10 DIAGNOSIS — E876 Hypokalemia: Secondary | ICD-10-CM | POA: Diagnosis not present

## 2024-01-10 DIAGNOSIS — Z7982 Long term (current) use of aspirin: Secondary | ICD-10-CM | POA: Diagnosis not present

## 2024-01-10 LAB — URINALYSIS, ROUTINE W REFLEX MICROSCOPIC
Bilirubin Urine: NEGATIVE
Glucose, UA: NEGATIVE mg/dL
Hgb urine dipstick: NEGATIVE
Ketones, ur: NEGATIVE mg/dL
Leukocytes,Ua: NEGATIVE
Nitrite: NEGATIVE
Protein, ur: NEGATIVE mg/dL
Specific Gravity, Urine: 1.014 (ref 1.005–1.030)
pH: 5 (ref 5.0–8.0)

## 2024-01-10 LAB — CBC
HCT: 33.9 % — ABNORMAL LOW (ref 36.0–46.0)
Hemoglobin: 10.2 g/dL — ABNORMAL LOW (ref 12.0–15.0)
MCH: 23 pg — ABNORMAL LOW (ref 26.0–34.0)
MCHC: 30.1 g/dL (ref 30.0–36.0)
MCV: 76.5 fL — ABNORMAL LOW (ref 80.0–100.0)
Platelets: 234 K/uL (ref 150–400)
RBC: 4.43 MIL/uL (ref 3.87–5.11)
RDW: 15.4 % (ref 11.5–15.5)
WBC: 9.5 K/uL (ref 4.0–10.5)
nRBC: 0 % (ref 0.0–0.2)

## 2024-01-10 LAB — LIPASE, BLOOD: Lipase: 21 U/L (ref 11–51)

## 2024-01-10 LAB — COMPREHENSIVE METABOLIC PANEL WITH GFR
ALT: 13 U/L (ref 0–44)
AST: 16 U/L (ref 15–41)
Albumin: 3.8 g/dL (ref 3.5–5.0)
Alkaline Phosphatase: 59 U/L (ref 38–126)
Anion gap: 12 (ref 5–15)
BUN: 9 mg/dL (ref 6–20)
CO2: 19 mmol/L — ABNORMAL LOW (ref 22–32)
Calcium: 8.8 mg/dL — ABNORMAL LOW (ref 8.9–10.3)
Chloride: 106 mmol/L (ref 98–111)
Creatinine, Ser: 0.83 mg/dL (ref 0.44–1.00)
GFR, Estimated: >60 mL/min (ref >60)
Glucose, Bld: 117 mg/dL — ABNORMAL HIGH (ref 70–99)
Potassium: 3.3 mmol/L — ABNORMAL LOW (ref 3.5–5.1)
Sodium: 137 mmol/L (ref 135–145)
Total Bilirubin: 0.5 mg/dL (ref 0.0–1.2)
Total Protein: 6.5 g/dL (ref 6.5–8.1)

## 2024-01-10 LAB — TROPONIN I (HIGH SENSITIVITY)
Troponin I (High Sensitivity): <2 ng/L (ref <18)
Troponin I (High Sensitivity): <2 ng/L (ref <18)

## 2024-01-10 LAB — HCG, SERUM, QUALITATIVE: Preg, Serum: NEGATIVE

## 2024-01-10 MED ORDER — ALUM & MAG HYDROXIDE-SIMETH 200-200-20 MG/5ML PO SUSP
30.0000 mL | Freq: Once | ORAL | Status: AC
  Administered 2024-01-10: 30 mL via ORAL
  Filled 2024-01-10: qty 30

## 2024-01-10 MED ORDER — POLYETHYLENE GLYCOL 3350 17 G PO PACK: 17.0000 g | PACK | Freq: Every day | ORAL | 0 refills | Status: AC

## 2024-01-10 MED ORDER — KETOROLAC TROMETHAMINE 15 MG/ML IJ SOLN
15.0000 mg | Freq: Once | INTRAMUSCULAR | Status: AC
  Administered 2024-01-10: 15 mg via INTRAVENOUS
  Filled 2024-01-10: qty 1

## 2024-01-10 MED ORDER — DOCUSATE SODIUM 100 MG PO CAPS: 100.0000 mg | ORAL_CAPSULE | Freq: Two times a day (BID) | ORAL | 0 refills | Status: AC

## 2024-01-10 MED ORDER — IOHEXOL 350 MG/ML SOLN
75.0000 mL | Freq: Once | INTRAVENOUS | Status: AC | PRN
  Administered 2024-01-10: 75 mL via INTRAVENOUS

## 2024-01-10 MED ORDER — ONDANSETRON HCL 4 MG/2ML IJ SOLN
4.0000 mg | Freq: Once | INTRAMUSCULAR | Status: AC
  Administered 2024-01-10: 4 mg via INTRAVENOUS
  Filled 2024-01-10: qty 2

## 2024-01-10 MED ORDER — HYOSCYAMINE SULFATE 0.125 MG SL SUBL
0.2500 mg | SUBLINGUAL_TABLET | Freq: Once | SUBLINGUAL | Status: AC
  Administered 2024-01-10: 0.25 mg via SUBLINGUAL
  Filled 2024-01-10: qty 2

## 2024-01-10 MED ORDER — MORPHINE SULFATE (PF) 4 MG/ML IV SOLN
4.0000 mg | Freq: Once | INTRAVENOUS | Status: AC
  Administered 2024-01-10: 4 mg via INTRAVENOUS
  Filled 2024-01-10: qty 1

## 2024-01-10 MED ORDER — OXYCODONE HCL 5 MG PO TABS: 5.0000 mg | ORAL_TABLET | Freq: Once | ORAL | Status: DC

## 2024-01-10 NOTE — Discharge Instructions (Addendum)
 You can take docusate twice daily to help loosen stool.  You can take MiraLAX as needed for continued constipation.  I would recommend taking naproxen or Tylenol  for pain control.  You can follow-up with GI.  Return to ER with new or worsening symptoms.

## 2024-01-10 NOTE — ED Triage Notes (Signed)
 Pt arrived from home via [facility] c/o abd pain 4/10 that began after pt attempted to have a bowel movement.  Pt states that she feels like it is hard to take a deep breath

## 2024-01-10 NOTE — ED Provider Notes (Signed)
 [place] [facility] AT [facility] Provider Note   CSN: [id number] Arrival date & time: 01/10/24  9395     Patient presents with: Abdominal Pain   Karen Duarte is a 38 y.o. female with past medical history of GERD presenting to ER with complaint of upper abdominal pain in epigastric area, worse when taking a deep breath in, patient was straining to have a BM last night and then this started. Denies recent travel, recent surgery. No history of DVT/PE.  Denies drug or EtOH use. No cough, fever, SOB, CP.     Abdominal Pain      Prior to Admission medications   Medication Sig Start Date End Date Taking? Authorizing Provider  docusate sodium  (COLACE) 100 MG capsule Take 1 capsule (100 mg total) by mouth every 12 (twelve) hours. 01/10/24  Yes Nashira Mcglynn, Warren SAILOR, PA-C  polyethylene glycol (MIRALAX) 17 g packet Take 17 g by mouth daily. 01/10/24  Yes Remy Voiles, Warren SAILOR, PA-C  acetaminophen  (TYLENOL ) 325 MG tablet Take 975 mg by mouth daily as needed for headache.    [provider]  aspirin-acetaminophen -caffeine (EXCEDRIN MIGRAINE) 250-250-65 MG tablet Take 2 tablets by mouth daily as needed for headache.    [provider]  cephALEXin  (KEFLEX ) 500 MG capsule Take 1 capsule (500 mg total) by mouth 4 (four) times daily. 11/05/21   Zackowski, Scott, MD  gabapentin (NEURONTIN) 300 MG capsule Take 300 mg by mouth 2 (two) times daily.    [provider]  ibuprofen  (ADVIL ) 200 MG tablet Take 600-800 mg by mouth daily as needed for headache.    [provider]  ondansetron  (ZOFRAN -ODT) 4 MG disintegrating tablet Take 1 tablet (4 mg total) by mouth every 8 (eight) hours as needed for nausea or vomiting. 11/05/21   Zackowski, Scott, MD  oxyCODONE -acetaminophen  (PERCOCET/ROXICET) 5-325 MG tablet Take 1 tablet by mouth every 6 (six) hours as needed for severe pain. 11/05/21   Zackowski, Scott, MD    Allergies: Latex    Review of Systems   Gastrointestinal:  Positive for abdominal pain.    Updated Vital Signs BP (!) 100/51 (BP Location: Right Arm)   Pulse 94   Temp 98.5 F (36.9 C) (Oral)   Resp (!) 21   Ht 5' 3 (1.6 m)   Wt 61.2 kg   LMP 12/10/2023   SpO2 100%   BMI 23.91 kg/m   Physical Exam Vitals and nursing note reviewed.  Constitutional:      General: She is not in acute distress.    Appearance: She is not toxic-appearing.  HENT:     Head: Normocephalic and atraumatic.  Eyes:     General: No scleral icterus.    Conjunctiva/sclera: Conjunctivae normal.  Cardiovascular:     Rate and Rhythm: Normal rate and regular rhythm.     Pulses: Normal pulses.     Heart sounds: Normal heart sounds.  Pulmonary:     Effort: Pulmonary effort is normal. No respiratory distress.     Breath sounds: Normal breath sounds.  Abdominal:     General: Abdomen is flat. Bowel sounds are normal.     Palpations: Abdomen is soft.     Tenderness: There is abdominal tenderness in the epigastric area.  Skin:    General: Skin is warm and dry.     Findings: No lesion.  Neurological:     General: No focal deficit present.     Mental Status: She is alert and oriented to person,  place, and time. Mental status is at baseline.     (all labs ordered are listed, but only abnormal results are displayed) Labs Reviewed  COMPREHENSIVE METABOLIC PANEL WITH GFR - Abnormal; Notable for the following components:      Result Value   Potassium 3.3 (*)    CO2 19 (*)    Glucose, Bld 117 (*)    Calcium  8.8 (*)    All other components within normal limits  CBC - Abnormal; Notable for the following components:   Hemoglobin 10.2 (*)    HCT 33.9 (*)    MCV 76.5 (*)    MCH 23.0 (*)    All other components within normal limits  LIPASE, BLOOD  URINALYSIS, ROUTINE W REFLEX MICROSCOPIC  HCG, SERUM, QUALITATIVE  TROPONIN I (HIGH SENSITIVITY)  TROPONIN I (HIGH SENSITIVITY)    EKG: None  Radiology: CT Angio Chest PE W and/or Wo  Contrast Result Date: 01/10/2024 EXAM: CTA CHEST PE WITH AND WITHOUT CONTRAST CT ABDOMEN AND PELVIS WITH CONTRAST 01/10/2024 08:05:09 AM TECHNIQUE: CTA of the chest was performed after the administration of intravenous contrast. Multiplanar reformatted images are provided for review. MIP images are provided for review. CT of the abdomen and pelvis was performed with the administration of intravenous contrast. Automated exposure control, iterative reconstruction, and/or weight based adjustment of the mA/kV was utilized to reduce the radiation dose to as low as reasonably achievable. COMPARISON: 11/05/2021 CLINICAL HISTORY: Pulmonary embolism (PE) suspected, low to intermediate prob, positive D-dimer. Pt arrived from home via [facility] c/o abd pain 4/10 that began after pt attempted to have a bowel movement. Pt states that she feels like it is hard to take a deep breath. FINDINGS: CHEST: PULMONARY ARTERIES: Pulmonary arteries are adequately opacified for evaluation. No intraluminal filling defect to suggest pulmonary embolism. Main pulmonary artery is normal in caliber. MEDIASTINUM: No mediastinal lymphadenopathy. The heart and pericardium demonstrate no acute abnormality. There is no acute abnormality of the thoracic aorta. LUNGS AND PLEURA: The lungs are without acute process. No focal consolidation or pulmonary edema. No pleural effusion or pneumothorax. SOFT TISSUES AND BONES: No acute bone or soft tissue abnormality. ABDOMEN AND PELVIS: LIVER: The liver is unremarkable. GALLBLADDER AND BILE DUCTS: Gallbladder is unremarkable. No biliary ductal dilatation. SPLEEN: Spleen demonstrates no acute abnormality. PANCREAS: Pancreas is normal in size and contour without focal lesion or ductal dilatation. ADRENAL GLANDS: Adrenal glands demonstrate no acute abnormality. KIDNEYS, URETERS AND BLADDER: No stones in the kidneys or ureters. No hydronephrosis. No perinephric or periureteral stranding. Urinary bladder is  unremarkable. GI AND BOWEL: Stomach and duodenal sweep demonstrate no acute abnormality. There is no bowel obstruction. No abnormal bowel wall thickening or distension. There is a moderate stool burden noted throughout the colon and rectum. The appendix is visualized and normal in caliber, without wall thickening, periappendiceal inflammation, or fluid. REPRODUCTIVE: Uterus appears normal. Right ovary corpus luteum. Dominant left ovary follicle measures 1.8 cm. No follow-up imaging recommended. PERITONEUM AND RETROPERITONEUM: No ascites or free air. There is no free fluid or fluid collections. LYMPH NODES: No lymphadenopathy. BONES AND SOFT TISSUES: No acute abnormality of the visualized bones. No focal soft tissue abnormality. Small fat-containing umbilical hernia. Patent upper abdominal vascularity. IMPRESSION: 1. No pulmonary embolism. 2. No acute abdominal or pelvic process. 3. There is a moderate volume of retained stool noted throughout the colon and rectum. Correlate for clinical signs or symptoms of constipation. Electronically signed by: Waddell Calk MD 01/10/2024 08:31 AM EDT RP Workstation: [id number]  CT ABDOMEN PELVIS W CONTRAST Result Date: 01/10/2024 EXAM: CTA CHEST PE WITH AND WITHOUT CONTRAST CT ABDOMEN AND PELVIS WITH CONTRAST 01/10/2024 08:05:09 AM TECHNIQUE: CTA of the chest was performed after the administration of intravenous contrast. Multiplanar reformatted images are provided for review. MIP images are provided for review. CT of the abdomen and pelvis was performed with the administration of intravenous contrast. Automated exposure control, iterative reconstruction, and/or weight based adjustment of the mA/kV was utilized to reduce the radiation dose to as low as reasonably achievable. COMPARISON: 11/05/2021 CLINICAL HISTORY: Pulmonary embolism (PE) suspected, low to intermediate prob, positive D-dimer. Pt arrived from home via [facility] c/o abd pain 4/10 that began after pt attempted to  have a bowel movement. Pt states that she feels like it is hard to take a deep breath. FINDINGS: CHEST: PULMONARY ARTERIES: Pulmonary arteries are adequately opacified for evaluation. No intraluminal filling defect to suggest pulmonary embolism. Main pulmonary artery is normal in caliber. MEDIASTINUM: No mediastinal lymphadenopathy. The heart and pericardium demonstrate no acute abnormality. There is no acute abnormality of the thoracic aorta. LUNGS AND PLEURA: The lungs are without acute process. No focal consolidation or pulmonary edema. No pleural effusion or pneumothorax. SOFT TISSUES AND BONES: No acute bone or soft tissue abnormality. ABDOMEN AND PELVIS: LIVER: The liver is unremarkable. GALLBLADDER AND BILE DUCTS: Gallbladder is unremarkable. No biliary ductal dilatation. SPLEEN: Spleen demonstrates no acute abnormality. PANCREAS: Pancreas is normal in size and contour without focal lesion or ductal dilatation. ADRENAL GLANDS: Adrenal glands demonstrate no acute abnormality. KIDNEYS, URETERS AND BLADDER: No stones in the kidneys or ureters. No hydronephrosis. No perinephric or periureteral stranding. Urinary bladder is unremarkable. GI AND BOWEL: Stomach and duodenal sweep demonstrate no acute abnormality. There is no bowel obstruction. No abnormal bowel wall thickening or distension. There is a moderate stool burden noted throughout the colon and rectum. The appendix is visualized and normal in caliber, without wall thickening, periappendiceal inflammation, or fluid. REPRODUCTIVE: Uterus appears normal. Right ovary corpus luteum. Dominant left ovary follicle measures 1.8 cm. No follow-up imaging recommended. PERITONEUM AND RETROPERITONEUM: No ascites or free air. There is no free fluid or fluid collections. LYMPH NODES: No lymphadenopathy. BONES AND SOFT TISSUES: No acute abnormality of the visualized bones. No focal soft tissue abnormality. Small fat-containing umbilical hernia. Patent upper abdominal  vascularity. IMPRESSION: 1. No pulmonary embolism. 2. No acute abdominal or pelvic process. 3. There is a moderate volume of retained stool noted throughout the colon and rectum. Correlate for clinical signs or symptoms of constipation. Electronically signed by: Waddell Calk MD 01/10/2024 08:31 AM EDT RP Workstation: [id number]   DG Chest Portable 1 View Result Date: 01/10/2024 EXAM: 1 VIEW XRAY OF THE CHEST 01/10/2024 06:47:00 AM COMPARISON: 01/30/21 CLINICAL HISTORY: Upper abdominal pain. Chest abdominal pain. FINDINGS: LUNGS AND PLEURA: No focal pulmonary opacity. No pulmonary edema. No pleural effusion. No pneumothorax. HEART AND MEDIASTINUM: No acute abnormality of the cardiac and mediastinal silhouettes. BONES AND SOFT TISSUES: No acute osseous abnormality. IMPRESSION: 1. No acute process. Electronically signed by: Waddell Calk MD 01/10/2024 06:51 AM EDT RP Workstation: [id number]     Procedures   Medications Ordered in the ED  ondansetron  (ZOFRAN ) injection 4 mg (4 mg Intravenous Given 01/10/24 0707)  morphine (PF) 4 MG/ML injection 4 mg (4 mg Intravenous Given 01/10/24 0708)  iohexol (OMNIPAQUE) 350 MG/ML injection 75 mL (75 mLs Intravenous Contrast Given 01/10/24 0806)  alum & mag hydroxide-simeth (MAALOX/MYLANTA) 200-200-20 MG/5ML suspension 30 mL (30 mLs Oral Given  01/10/24 0909)  hyoscyamine (LEVSIN SL) SL tablet 0.25 mg (0.25 mg Sublingual Given 01/10/24 0909)  ketorolac  (TORADOL ) 15 MG/ML injection 15 mg (15 mg Intravenous Given 01/10/24 0909)                                    Medical Decision Making Amount and/or Complexity of Data Reviewed Labs: ordered. Radiology: ordered.  Risk OTC drugs. Prescription drug management.   This patient presents to the ED for concern of abdominal pain, this involves an extensive number of treatment options, and is a complaint that carries with it a high risk of complications and morbidity.  The differential diagnosis includes  cholecystitis, AAA, appendicitis, renal stone, UTI    Lab Tests:  I personally interpreted labs.  The pertinent results include:   No leukocytosis, normal kidney and liver function.  Lipase within normal limits.  Troponin within normal limits   Imaging Studies ordered:  I ordered imaging studies including CT PE study, chest abdomen pelvis I independently visualized and interpreted imaging which showed no acute findings I agree with the radiologist interpretation   Cardiac Monitoring: / EKG:  The patient was maintained on a cardiac monitor.  I personally viewed and interpreted the cardiac monitored which showed an underlying rhythm of: Normal sinus rhythm    Problem List / ED Course / Critical interventions / Medication management  Patient presents to emergency room with complaint of generalized abdominal pain which she locates to the epigastric area that is worse when she takes deep breath and or when she is moving.  On arrival she is hemodynamically stable and she is well-appearing.  She has had some associated nausea with this.  She denies any injury trauma or fall.  She does have some mild reproducible tenderness to her upper abdomen but abdomen is otherwise soft and nondistended.  Given this is in the epigastric area I did obtain troponin which was normal and EKG shows normal sinus rhythm thus doubt this is ACS.  Her lab work is primarily unremarkable.  Her CT scan of her chest abdomen pelvis shows no pulmonary embolism and no acute findings. I ordered medication including morphine, Zofran  and GI cocktail for abdominal pain Reevaluation of the patient after these medicines showed that the patient improved I have reviewed the patients home medicines and have made adjustments as needed. On reassessment patient had improvement in her symptoms.  She has been stable throughout stay.  She has reassuring workup here.  Feel she is appropriate for discharge with outpatient follow-up.  She was  given return precautions.         Final diagnoses:  Epigastric pain    ED Discharge Orders          Ordered    docusate sodium  (COLACE) 100 MG capsule  Every 12 hours        01/10/24 0854    polyethylene glycol (MIRALAX) 17 g packet  Daily        01/10/24 0854               Shermon Warren SAILOR, PA-C 01/10/24 1007    Tegeler, Lonni PARAS, MD 01/10/24 1511

## 2024-01-10 NOTE — ED Provider Triage Note (Signed)
 Emergency Medicine Provider Triage Evaluation Note  Karen Duarte , a 38 y.o. female  was evaluated in triage.  Pt complains of epigastric pain that started last night and nausea.  Review of Systems  Positive: Epigastric pain Negative: Chest pain, shortness of breath, cough, fever  Physical Exam  BP 107/63 (BP Location: Left Arm)   Pulse 100   Temp 98.8 F (37.1 C)   Resp 18   Ht 5' 3 (1.6 m)   Wt 61.2 kg   LMP 12/10/2023   SpO2 100%   BMI 23.91 kg/m  Gen:   Awake, no distress   Resp:  Normal effort  MSK:   Moves extremities without difficulty  Other:  Mild TTP to epigastric area  Medical Decision Making  Medically screening exam initiated at 6:39 AM.  Appropriate orders placed.  Karen Duarte was informed that the remainder of the evaluation will be completed by another provider, this initial triage assessment does not replace that evaluation, and the importance of remaining in the ED until their evaluation is complete.     Karen Warren SAILOR, PA-C 01/10/24 9359
